# Patient Record
Sex: Male | Born: 1950
Health system: Southern US, Community
[De-identification: ages and names within clinical notes are randomized; demographics above are authoritative.]

## PROBLEM LIST (undated history)

## (undated) DIAGNOSIS — I10 Essential (primary) hypertension: Secondary | ICD-10-CM

## (undated) DIAGNOSIS — R55 Syncope and collapse: Secondary | ICD-10-CM

## (undated) DIAGNOSIS — I5032 Chronic diastolic (congestive) heart failure: Secondary | ICD-10-CM

## (undated) DIAGNOSIS — I482 Chronic atrial fibrillation, unspecified: Secondary | ICD-10-CM

## (undated) DIAGNOSIS — N189 Chronic kidney disease, unspecified: Secondary | ICD-10-CM

## (undated) DIAGNOSIS — J449 Chronic obstructive pulmonary disease, unspecified: Secondary | ICD-10-CM

## (undated) DIAGNOSIS — F1910 Other psychoactive substance abuse, uncomplicated: Secondary | ICD-10-CM

## (undated) DIAGNOSIS — I2510 Atherosclerotic heart disease of native coronary artery without angina pectoris: Secondary | ICD-10-CM

## (undated) DIAGNOSIS — E785 Hyperlipidemia, unspecified: Secondary | ICD-10-CM

## (undated) DIAGNOSIS — B1920 Unspecified viral hepatitis C without hepatic coma: Secondary | ICD-10-CM

## (undated) HISTORY — DX: Hyperlipidemia, unspecified: E78.5

## (undated) HISTORY — DX: Essential (primary) hypertension: I10

## (undated) HISTORY — PX: CARDIAC SURGERY: SHX584

## (undated) HISTORY — DX: Chronic atrial fibrillation, unspecified: I48.20

## (undated) HISTORY — DX: Other psychoactive substance abuse, uncomplicated: F19.10

## (undated) HISTORY — DX: Chronic obstructive pulmonary disease, unspecified: J44.9

## (undated) HISTORY — DX: Chronic diastolic (congestive) heart failure: I50.32

## (undated) HISTORY — PX: BACK SURGERY: SHX140

## (undated) HISTORY — PX: KNEE SURGERY: SHX244

## (undated) HISTORY — DX: Unspecified viral hepatitis C without hepatic coma: B19.20

## (undated) HISTORY — DX: Chronic kidney disease, unspecified: N18.9

## (undated) HISTORY — DX: Syncope and collapse: R55

---

## 2014-08-19 DIAGNOSIS — R768 Other specified abnormal immunological findings in serum: Secondary | ICD-10-CM | POA: Insufficient documentation

## 2016-12-02 DIAGNOSIS — M1990 Unspecified osteoarthritis, unspecified site: Secondary | ICD-10-CM | POA: Insufficient documentation

## 2017-05-17 DIAGNOSIS — I350 Nonrheumatic aortic (valve) stenosis: Secondary | ICD-10-CM

## 2017-05-17 DIAGNOSIS — Z952 Presence of prosthetic heart valve: Secondary | ICD-10-CM | POA: Insufficient documentation

## 2017-05-17 HISTORY — DX: Nonrheumatic aortic (valve) stenosis: I35.0

## 2017-05-17 HISTORY — DX: Presence of prosthetic heart valve: Z95.2

## 2017-06-13 DIAGNOSIS — I340 Nonrheumatic mitral (valve) insufficiency: Secondary | ICD-10-CM | POA: Insufficient documentation

## 2017-06-14 DIAGNOSIS — D638 Anemia in other chronic diseases classified elsewhere: Secondary | ICD-10-CM | POA: Insufficient documentation

## 2017-06-14 DIAGNOSIS — I2510 Atherosclerotic heart disease of native coronary artery without angina pectoris: Secondary | ICD-10-CM | POA: Insufficient documentation

## 2017-06-23 DIAGNOSIS — J9611 Chronic respiratory failure with hypoxia: Secondary | ICD-10-CM | POA: Insufficient documentation

## 2018-06-16 DIAGNOSIS — M19011 Primary osteoarthritis, right shoulder: Secondary | ICD-10-CM | POA: Insufficient documentation

## 2019-11-27 ENCOUNTER — Telehealth: Payer: Self-pay | Admitting: Internal Medicine

## 2019-11-28 ENCOUNTER — Ambulatory Visit: Payer: Self-pay | Admitting: Internal Medicine

## 2019-12-13 ENCOUNTER — Telehealth: Payer: Self-pay | Admitting: Internal Medicine

## 2019-12-14 ENCOUNTER — Encounter (HOSPITAL_BASED_OUTPATIENT_CLINIC_OR_DEPARTMENT_OTHER): Payer: Self-pay

## 2019-12-14 ENCOUNTER — Other Ambulatory Visit: Payer: Self-pay

## 2019-12-14 ENCOUNTER — Emergency Department (HOSPITAL_BASED_OUTPATIENT_CLINIC_OR_DEPARTMENT_OTHER)
Admission: EM | Admit: 2019-12-14 | Discharge: 2019-12-14 | Disposition: A | Discharge status: Home / Self Care | Payer: Medicare Other | Attending: Emergency Medicine | Admitting: Emergency Medicine

## 2019-12-14 ENCOUNTER — Emergency Department (HOSPITAL_BASED_OUTPATIENT_CLINIC_OR_DEPARTMENT_OTHER): Payer: Medicare Other

## 2019-12-14 DIAGNOSIS — Y929 Unspecified place or not applicable: Secondary | ICD-10-CM | POA: Diagnosis not present

## 2019-12-14 DIAGNOSIS — Y998 Other external cause status: Secondary | ICD-10-CM | POA: Diagnosis not present

## 2019-12-14 DIAGNOSIS — M25561 Pain in right knee: Secondary | ICD-10-CM

## 2019-12-14 DIAGNOSIS — X501XXA Overexertion from prolonged static or awkward postures, initial encounter: Secondary | ICD-10-CM | POA: Diagnosis not present

## 2019-12-14 DIAGNOSIS — S80911A Unspecified superficial injury of right knee, initial encounter: Secondary | ICD-10-CM | POA: Diagnosis not present

## 2019-12-14 DIAGNOSIS — Y939 Activity, unspecified: Secondary | ICD-10-CM | POA: Diagnosis not present

## 2019-12-14 DIAGNOSIS — Y999 Unspecified external cause status: Secondary | ICD-10-CM | POA: Diagnosis not present

## 2019-12-14 DIAGNOSIS — S8991XA Unspecified injury of right lower leg, initial encounter: Secondary | ICD-10-CM

## 2019-12-14 MED ORDER — OXYCODONE-ACETAMINOPHEN 5-325 MG PO TABS: 1.0000 | ORAL_TABLET | ORAL | 0 refills | Status: DC | PRN

## 2019-12-14 MED FILL — OXYCODONE-ACETAMINOPHEN 5-3: 5-325 | 3 days supply | Qty: 15 | Fill #0

## 2019-12-14 NOTE — ED Notes (Signed)
Pt provided urinal.

## 2019-12-14 NOTE — ED Provider Notes (Signed)
[facility] EMERGENCY DEPARTMENT Provider Note   CSN: [id number] Arrival date & time: 12/14/19  7341     History Chief Complaint  Patient presents with  . Knee Pain    Charles Rice is a 69 y.o. male.  The history is provided by the patient and medical records. No language interpreter was used.  Knee Pain Location:  Knee Time since incident:  1 day Injury: yes   Mechanism of injury comment:  Banding over/twisted knee overnight Knee location:  R knee Pain details:    Quality:  Sharp   Radiates to:  Does not radiate   Severity:  Severe   Onset quality:  Sudden   Timing:  Constant   Progression:  Waxing and waning Chronicity:  New Dislocation: no   Prior injury to area:  No Relieved by:  Nothing Worsened by:  Bearing weight, extension, flexion and rotation Ineffective treatments:  None tried Associated symptoms: no back pain, no fatigue, no fever, no neck pain, no numbness, no swelling and no tingling        History reviewed. No pertinent past medical history.  There are no problems to display for this patient.   History reviewed. No pertinent surgical history.     History reviewed. No pertinent family history.  Social History   Tobacco Use  . Smoking status: Never Smoker  . Smokeless tobacco: Current User  Substance Use Topics  . Alcohol use: Never  . Drug use: Never    Home Medications Prior to Admission medications   Not on File    Allergies    Patient has no known allergies.  Review of Systems   Review of Systems  Constitutional: Negative for chills, fatigue and fever.  HENT: Negative for congestion.   Eyes: Negative for visual disturbance.  Respiratory: Negative for cough, chest tightness, shortness of breath and wheezing.   Cardiovascular: Negative for chest pain.  Gastrointestinal: Negative for abdominal pain, constipation, diarrhea, nausea and vomiting.  Genitourinary: Negative for dysuria, flank pain and frequency.    Musculoskeletal: Negative for back pain, neck pain and neck stiffness.  Skin: Negative for rash and wound.  Neurological: Negative for light-headedness, numbness and headaches.  Psychiatric/Behavioral: Negative for agitation and confusion.  All other systems reviewed and are negative.   Physical Exam Updated Vital Signs BP 110/69 (BP Location: Right Arm)   Pulse 89   Temp 98.7 F (37.1 C) (Oral)   Resp 16   Ht 6' (1.829 m)   Wt (!) 104.3 kg   SpO2 96%   BMI 31.19 kg/m   Physical Exam Vitals and nursing note reviewed.  Constitutional:      General: He is not in acute distress.    Appearance: He is well-developed. He is not ill-appearing, toxic-appearing or diaphoretic.  HENT:     Head: Normocephalic and atraumatic.  Eyes:     Conjunctiva/sclera: Conjunctivae normal.  Cardiovascular:     Rate and Rhythm: Normal rate and regular rhythm.     Heart sounds: No murmur heard.   Pulmonary:     Effort: Pulmonary effort is normal. No respiratory distress.     Breath sounds: Normal breath sounds.  Abdominal:     General: Abdomen is flat.     Palpations: Abdomen is soft.     Tenderness: There is no abdominal tenderness. There is no right CVA tenderness, left CVA tenderness, guarding or rebound.  Musculoskeletal:        General: Tenderness present. No deformity or signs  of injury.     Cervical back: Neck supple.     Right knee: No deformity, erythema or lacerations. Tenderness present.     Right lower leg: No edema.     Left lower leg: No edema.       Legs:  Skin:    General: Skin is warm and dry.     Capillary Refill: Capillary refill takes less than 2 seconds.     Coloration: Skin is not pale.     Findings: No erythema.  Neurological:     General: No focal deficit present.     Mental Status: He is alert and oriented to person, place, and time.     Sensory: No sensory deficit.     Motor: No weakness.  Psychiatric:        Mood and Affect: Mood normal.     ED Results  / Procedures / Treatments   Labs (all labs ordered are listed, but only abnormal results are displayed) Labs Reviewed - No data to display  EKG None  Radiology DG Knee Complete 4 Views Right  Result Date: 12/14/2019 CLINICAL DATA:  Knee pain EXAM: RIGHT KNEE - COMPLETE 4+ VIEW COMPARISON:  None. FINDINGS: Prior right knee replacement. No hardware complicating feature. No acute bony abnormality. Specifically, no fracture, subluxation, or dislocation. Vascular calcifications noted. IMPRESSION: Prior right knee replacement.  No acute bony abnormality. Electronically Signed   By: Charlett Nose M.D.   On: 12/14/2019 11:09    Procedures Procedures (including critical care time)  Medications Ordered in ED Medications - No data to display  ED Course  I have reviewed the triage vital signs and the nursing notes.  Pertinent labs & imaging results that were available during my care of the patient were reviewed by me and considered in my medical decision making (see chart for details).    MDM Rules/Calculators/A&P                          Charles Rice is a 69 y.o. male with a past medical history significant for bilateral knee replacements and left hip replacement who presents with right knee pain.  Patient reports that last night he was bending down to pick something up and his knee slightly twisted causing immediate onset of severe pain.  He reports he tried to bend over again and the pain was severe.  He reports he had pain with walking and his pain gets up to a 10 out of 10 when he tries to bend his knee.  He denies any recent fevers, chills, chest, or cough.  He denies any rashes or skin infection overlying the knee.  Denies any numbness, tingling, or weakness but reports severe pain.  He reports she did not fall or have direct contact on it.  He has been using his sister's cane to try to ambulate.  Denies other complaints or any other areas of discomfort.  On exam, lungs clear and chest  nontender.  Abdomen nontender.  Patient moving all extremities.  Patient had normal sensation, pulse, and capillary refill in both feet.  Patient had normal strength of the foot.  Patient had pain with knee manipulation on the right.  Tenderness of the knee.  No warmth or redness seen.  No calf tenderness.  Clinically low suspicion for DVT or cellulitis however I am concerned about a ligamentous or soft tissue injury to his knee.  I suspect he had a noncontact twisting type  injury either of the meniscus or a ligament.  We will get x-ray as the knee is replaced however if it is reassuring, anticipate application of a knee immobilizer and follow-up with his orthopedics team.  Based on his history and exam, low suspicion for an infectious arthropathy or septic joint.  Low suspicion for blood clot.  Suspect a injury based on the knee twisting at onset of pain.   1:35 PM Knee x-ray shows prior knee replacement with no acute bony abnormality or hardware abnormality.  Suspect soft tissue injury.  Patient placed in knee immobilizer and will follow up with orthopedics or sports medicine.  He was given both numbers.  He will be given prescription for pain medication.  He was given instructions on conservative management and close follow-up.  He understands return precautions.  He had no other questions or concerns and was discharged in good condition.   Final Clinical Impression(s) / ED Diagnoses Final diagnoses:  Injury of right knee, initial encounter  Acute pain of right knee    Rx / DC Orders ED Discharge Orders         Ordered    oxyCODONE-acetaminophen (PERCOCET/ROXICET) 5-325 MG tablet  Every 4 hours PRN     Discontinue  Reprint     12/14/19 1331         Clinical Impression: 1. Injury of right knee, initial encounter   2. Acute pain of right knee     Disposition: Discharge  Condition: Good  I have discussed the results, Dx and Tx plan with the pt(& family if present). He/she/they  expressed understanding and agree(s) with the plan. Discharge instructions discussed at great length. Strict return precautions discussed and pt &/or family have verbalized understanding of the instructions. No further questions at time of discharge.    New Prescriptions   OXYCODONE-ACETAMINOPHEN (PERCOCET/ROXICET) 5-325 MG TABLET    Take 1 tablet by mouth every 4 (four) hours as needed.    Follow Up: Myra Rude, MD [address] [place] [address] [phone]     Terance Hart, MD [address] [place] [address] [phone]   with orthopedics  [facility] [place] [facility] [address] [id number] [address] [place] [address] [phone]       Aseneth Hack, Canary Brim, MD 12/14/19 1336

## 2019-12-14 NOTE — ED Triage Notes (Signed)
Pt stated right knee pain since yesterday, difficulty bearing weight, s/p knee replacement 2015.

## 2019-12-14 NOTE — ED Notes (Signed)
Pt reports continuing pain to right knee, awaiting MD for xr result.  Provided warm blanket.

## 2019-12-14 NOTE — Discharge Instructions (Signed)
Your history and exam are consistent with a likely is soft tissue knee injury as it occurred while you were bending and twisting.  The x-ray showed the knee replacement with no hardware abnormalities or bony abnormalities.  Please use the pain medicine and knee immobilizer and follow-up with a sports medicine orthopedics provider.  You may need further advanced imaging.  Based on your exam and history we have low suspicion for a blood clot or infection.  Please rest and elevate it.  Please use the pain medicine.  If any symptoms change or worsen, please return to the nearest emergency department as well.

## 2020-01-02 ENCOUNTER — Telehealth: Payer: Self-pay | Admitting: Family Medicine

## 2020-01-02 NOTE — Telephone Encounter (Signed)
Patient has appt next Tues August 24th at 10:10am He wanted to leave all his medical history paperwork.  I advised him not sure Doctor will have the chance to review this information.  But he wanted to leave it anyway.  Putting information in the Dr Nobie Putnam box.

## 2020-01-08 ENCOUNTER — Encounter: Payer: Self-pay | Admitting: Family Medicine

## 2020-01-08 ENCOUNTER — Other Ambulatory Visit: Payer: Self-pay

## 2020-01-08 ENCOUNTER — Encounter (HOSPITAL_BASED_OUTPATIENT_CLINIC_OR_DEPARTMENT_OTHER): Payer: Self-pay | Admitting: *Deleted

## 2020-01-08 ENCOUNTER — Ambulatory Visit (INDEPENDENT_AMBULATORY_CARE_PROVIDER_SITE_OTHER): Payer: Medicare Other | Admitting: Family Medicine

## 2020-01-08 ENCOUNTER — Emergency Department (HOSPITAL_BASED_OUTPATIENT_CLINIC_OR_DEPARTMENT_OTHER)
Admission: EM | Admit: 2020-01-08 | Discharge: 2020-01-08 | Disposition: A | Discharge status: Home / Self Care | Payer: Medicare Other | Attending: Emergency Medicine | Admitting: Emergency Medicine

## 2020-01-08 VITALS — BP 120/64 | HR 78 | Ht 72.0 in | Wt 235.0 lb

## 2020-01-08 DIAGNOSIS — G8929 Other chronic pain: Secondary | ICD-10-CM | POA: Diagnosis not present

## 2020-01-08 DIAGNOSIS — G894 Chronic pain syndrome: Secondary | ICD-10-CM | POA: Diagnosis not present

## 2020-01-08 DIAGNOSIS — Z9189 Other specified personal risk factors, not elsewhere classified: Secondary | ICD-10-CM | POA: Diagnosis not present

## 2020-01-08 DIAGNOSIS — M7918 Myalgia, other site: Secondary | ICD-10-CM | POA: Diagnosis not present

## 2020-01-08 DIAGNOSIS — J209 Acute bronchitis, unspecified: Secondary | ICD-10-CM | POA: Diagnosis not present

## 2020-01-08 DIAGNOSIS — F329 Major depressive disorder, single episode, unspecified: Secondary | ICD-10-CM | POA: Diagnosis not present

## 2020-01-08 DIAGNOSIS — I2510 Atherosclerotic heart disease of native coronary artery without angina pectoris: Secondary | ICD-10-CM | POA: Insufficient documentation

## 2020-01-08 DIAGNOSIS — Z114 Encounter for screening for human immunodeficiency virus [HIV]: Secondary | ICD-10-CM

## 2020-01-08 DIAGNOSIS — Z79899 Other long term (current) drug therapy: Secondary | ICD-10-CM | POA: Insufficient documentation

## 2020-01-08 DIAGNOSIS — Z1159 Encounter for screening for other viral diseases: Secondary | ICD-10-CM

## 2020-01-08 DIAGNOSIS — I509 Heart failure, unspecified: Secondary | ICD-10-CM | POA: Diagnosis not present

## 2020-01-08 DIAGNOSIS — E669 Obesity, unspecified: Secondary | ICD-10-CM | POA: Diagnosis not present

## 2020-01-08 DIAGNOSIS — I10 Essential (primary) hypertension: Secondary | ICD-10-CM | POA: Diagnosis present

## 2020-01-08 DIAGNOSIS — M542 Cervicalgia: Secondary | ICD-10-CM | POA: Insufficient documentation

## 2020-01-08 DIAGNOSIS — F32A Depression, unspecified: Secondary | ICD-10-CM

## 2020-01-08 DIAGNOSIS — J449 Chronic obstructive pulmonary disease, unspecified: Secondary | ICD-10-CM

## 2020-01-08 DIAGNOSIS — J440 Chronic obstructive pulmonary disease with acute lower respiratory infection: Secondary | ICD-10-CM

## 2020-01-08 DIAGNOSIS — Z7689 Persons encountering health services in other specified circumstances: Secondary | ICD-10-CM | POA: Diagnosis not present

## 2020-01-08 HISTORY — DX: Atherosclerotic heart disease of native coronary artery without angina pectoris: I25.10

## 2020-01-08 MED ORDER — FERROUS SULFATE 325 (65 FE) MG PO TABS: 325.0000 mg | ORAL_TABLET | ORAL | 0 refills | Status: DC

## 2020-01-08 MED ORDER — METHOCARBAMOL 500 MG PO TABS: 500.0000 mg | ORAL_TABLET | Freq: Three times a day (TID) | ORAL | 0 refills | Status: AC | PRN

## 2020-01-08 MED ORDER — TOPIRAMATE 100 MG PO TABS: 100.0000 mg | ORAL_TABLET | Freq: Every day | ORAL | 0 refills | Status: DC

## 2020-01-08 MED ORDER — HYDROCODONE-ACETAMINOPHEN 5-325 MG PO TABS: 1.0000 | ORAL_TABLET | ORAL | 0 refills | Status: DC | PRN

## 2020-01-08 MED ORDER — SPIRONOLACTONE 25 MG PO TABS: 25.0000 mg | ORAL_TABLET | Freq: Every day | ORAL | 0 refills | Status: DC

## 2020-01-08 MED ORDER — TETANUS-DIPHTH-ACELL PERTUSSIS 5-2.5-18.5 LF-MCG/0.5 IM SUSP: 0.5000 mL | Freq: Once | INTRAMUSCULAR | 0 refills | Status: AC

## 2020-01-08 MED ORDER — PREDNISONE 10 MG PO TABS: 10.0000 mg | ORAL_TABLET | ORAL | 0 refills | Status: DC

## 2020-01-08 MED ORDER — AMITRIPTYLINE HCL 100 MG PO TABS: 100.0000 mg | ORAL_TABLET | Freq: Every day | ORAL | 0 refills | Status: DC

## 2020-01-08 MED ORDER — FUROSEMIDE 80 MG PO TABS
ORAL_TABLET | ORAL | 0 refills | Status: DC
Start: 2020-01-08 — End: 2020-02-25

## 2020-01-08 MED ORDER — HYDROCODONE-ACETAMINOPHEN 5-325 MG PO TABS
2.0000 | ORAL_TABLET | Freq: Once | ORAL | Status: AC
  Administered 2020-01-08: 2 via ORAL
  Filled 2020-01-08: qty 2

## 2020-01-08 MED ORDER — MAGNESIUM OXIDE 400 MG PO TABS: 400.0000 mg | ORAL_TABLET | Freq: Every day | ORAL | 0 refills | Status: DC

## 2020-01-08 MED ORDER — POTASSIUM CHLORIDE ER 10 MEQ PO TBCR
10.0000 meq | EXTENDED_RELEASE_TABLET | Freq: Every day | ORAL | 0 refills | Status: DC
Start: 2020-01-08 — End: 2020-02-25

## 2020-01-08 NOTE — Progress Notes (Signed)
SUBJECTIVE:   CHIEF COMPLAINT / HPI:   Patient is here to establish care.  PMH EtOH and drug abuse in 2007 clean since 2009  Arthritis in 2002 Chronic pain in 2002 CHF in 2009 COPD in 2009 HTN diagnosed in 2009 Irregular heart beat in 2009 Kidney issues in 2009 Sleep apnea in 2020  Past surgical history 2 knee replacements in 2014 and 2019 Coronary bypass surgery in 2019 Patient had a bone spur in his back operated on in 1992  Family history Maternal alcohol/drug abuse, cancer, depression Paternal alcohol abuse, early death Sister with autoimmune disease and hypertension  Social history Patient currently living with his sister and her husband.  Was incarcerated for the last 10 years.  History of drug abuse including IV drugs, cocaine, alcohol abuse.  He has been clean since being incarcerated.  Denies alcohol or drug abuse at this time, denies sexual activity.  Denies tobacco use.  Is unemployed and will be filing for disability.  OBJECTIVE:   BP 120/64   Pulse 78   Ht 6' (1.829 m)   Wt 235 lb (106.6 kg)   SpO2 98%   BMI 31.87 kg/m   Physical Exam Constitutional:      General: He is not in acute distress.    Appearance: Normal appearance. He is obese. He is not ill-appearing.  HENT:     Head: Normocephalic and atraumatic.     Nose: Nose normal. No congestion or rhinorrhea.     Mouth/Throat:     Mouth: Mucous membranes are moist.     Pharynx: Oropharynx is clear. No oropharyngeal exudate or posterior oropharyngeal erythema.  Eyes:     General: No scleral icterus.       Right eye: No discharge.        Left eye: No discharge.     Extraocular Movements: Extraocular movements intact.     Conjunctiva/sclera: Conjunctivae normal.     Pupils: Pupils are equal, round, and reactive to light.  Cardiovascular:     Rate and Rhythm: Normal rate. Rhythm irregular.     Pulses: Normal pulses.     Heart sounds: No murmur heard.   Pulmonary:     Effort: Pulmonary  effort is normal. No respiratory distress.     Breath sounds: Normal breath sounds. No stridor. No wheezing or rhonchi.  Abdominal:     General: Bowel sounds are normal.     Palpations: Abdomen is soft. There is no mass.     Tenderness: There is no abdominal tenderness.     Hernia: No hernia is present.  Musculoskeletal:        General: Tenderness (Patient with mild neck tenderness but no red flag symptoms) present. Normal range of motion.     Right lower leg: Edema (trace) present.     Left lower leg: Edema (trace) present.  Skin:    General: Skin is warm and dry.  Neurological:     General: No focal deficit present.     Mental Status: He is alert.     Cranial Nerves: No cranial nerve deficit.  Psychiatric:        Mood and Affect: Mood normal.        Behavior: Behavior normal.     ASSESSMENT/PLAN:   Encounter to establish care Patient is here to establish care.  He has complicated past medical history with many comorbidities and medications.  We do not have access to his previous medical records and patient reports that he  is in the process of requesting those.  Patient has a number of medications which he is asking for refills for and is asking for opiate pain medications for chronic pain management. -Patient reports history of A. fib with coronary bypass, cardiology referral made -Patient reports history of COPD pulmonology referral made -Patient reports chronic pain in his neck and back.  He is requesting opiate pain medications at this time but I informed him that his clinic policy that we not do not prescribe opiates on initial visit, he is understanding. -Pain management referral placed -Hepatitis C screening collected today -Follow-up in 1 month for medication management  COPD (chronic obstructive pulmonary disease) (HCC) Patient reports history of COPD and would like a pulmonology referral.  He is currently taking albuterol as needed. -Albuterol inhaler  refilled -Referral placed for pulmonology   Chronic pain Patient reports chronic neck and lower back pain.  He has prescription for oxycodone and would like a refill on this.  I informed him that if this is his initial visit in his clinic policy that we do not fill opiate prescriptions in the first visit. -Ambulatory referral placed for chronic pain management   Hypertension Patient reports history of hypertension along with other heart issues but does not have any of his medical records.  Reports that he has had a bypass in the past and that he takes Lasix and spironolactone.  He reports other blood pressure and heart medications but he does not need refills on those at this time. -Lasix and spironolactone refilled -Patient has trace lower extremity edema but lungs are clear on exam.  He does report occasional shortness of breath with exertion, BNP ordered -CMP ordered -TSH ordered       Derrel Nip, MD [facility], [facility]

## 2020-01-08 NOTE — Assessment & Plan Note (Signed)
Patient reports chronic neck and lower back pain.  He has prescription for oxycodone and would like a refill on this.  I informed him that if this is his initial visit in his clinic policy that we do not fill opiate prescriptions in the first visit. -Ambulatory referral placed for chronic pain management

## 2020-01-08 NOTE — Assessment & Plan Note (Signed)
Patient reports history of hypertension along with other heart issues but does not have any of his medical records.  Reports that he has had a bypass in the past and that he takes Lasix and spironolactone.  He reports other blood pressure and heart medications but he does not need refills on those at this time. -Lasix and spironolactone refilled -Patient has trace lower extremity edema but lungs are clear on exam.  He does report occasional shortness of breath with exertion, BNP ordered -CMP ordered -TSH ordered

## 2020-01-08 NOTE — ED Triage Notes (Signed)
Neck pain x 5 months.

## 2020-01-08 NOTE — Patient Instructions (Signed)
It was a pleasure to meet you today!  We we took care documenting and reordering the medication that you needed refills on but I would like to see you back in 1 month get through the rest of your medical history.  I am going to collect some lab work today and will call you with any abnormal results.  I have also sent referrals in for cardiology, pulmonology as well as pain medicine.  Someone will be contacting you regarding those appointments.  I have sent a prescription for your tetanus vaccine to your pharmacy and you can receive it there.  You have any questions or concerns between now in 1 month please feel free to call clinic.  I hope you have a wonderful afternoon!

## 2020-01-08 NOTE — ED Provider Notes (Signed)
[facility] EMERGENCY DEPARTMENT Provider Note   CSN: [id number] Arrival date & time: 01/08/20  1247     History Chief Complaint  Patient presents with  . Neck Pain    Charles Rice is a 69 y.o. male.  HPI 69 year old male presents with left-sided neck pain.  He states it is chronic in nature and has been there for several months.  Worse over the last 1 week or so.  He has been taking his relatives muscle relaxer with no significant relief.  He cannot take NSAIDs because he is on Eliquis.  No trauma.  No fevers or weakness/numbness.  He has pain when moving his neck.  Went to his PCP appointment for the first time today and was told he cannot get narcotics from them because of this being their first visit.  He states he needs at least 15-20 Percocet. No illicit drug use (has prior history but none recently).   Past Medical History:  Diagnosis Date  . Coronary artery disease   . High cholesterol   . Hypertension     There are no problems to display for this patient.   Past Surgical History:  Procedure Laterality Date  . BACK SURGERY    . CARDIAC SURGERY    . KNEE SURGERY         No family history on file.  Social History   Tobacco Use  . Smoking status: Never Smoker  . Smokeless tobacco: Current User  Substance Use Topics  . Alcohol use: Not Currently  . Drug use: Never    Home Medications Prior to Admission medications   Medication Sig Start Date End Date Taking? Authorizing Provider  albuterol (VENTOLIN HFA) 108 (90 Base) MCG/ACT inhaler Inhale 2 puffs into the lungs every 6 (six) hours as needed.    [provider]  amitriptyline (ELAVIL) 100 MG tablet Take 1 tablet (100 mg total) by mouth at bedtime. 01/08/20   Derrel Nip, MD  ferrous sulfate 325 (65 FE) MG tablet Take 1 tablet (325 mg total) by mouth every other day. 01/08/20   Derrel Nip, MD  furosemide (LASIX) 80 MG tablet 2 tabs (160mg ) every morning and 1 tab (80mg ) every  evening. 01/08/20   Derrel Nip, MD  HYDROcodone-acetaminophen (NORCO) 5-325 MG tablet Take 1 tablet by mouth every 4 (four) hours as needed for severe pain. 01/08/20   Pricilla Loveless, MD  magnesium oxide (MAG-OX) 400 MG tablet Take 1 tablet (400 mg total) by mouth daily. 01/08/20   Derrel Nip, MD  methocarbamol (ROBAXIN) 500 MG tablet Take 1 tablet (500 mg total) by mouth every 8 (eight) hours as needed for muscle spasms. 01/08/20   Pricilla Loveless, MD  metoprolol tartrate (LOPRESSOR) 25 MG tablet Take 25 mg by mouth 2 (two) times daily.    [provider]  oxyCODONE-acetaminophen (PERCOCET/ROXICET) 5-325 MG tablet Take 1 tablet by mouth every 4 (four) hours as needed. 12/14/19   Tegeler, Canary Brim, MD  potassium chloride (KLOR-CON 10) 10 MEQ tablet Take 1 tablet (10 mEq total) by mouth daily. 01/08/20   Derrel Nip, MD  predniSONE (DELTASONE) 10 MG tablet Take 1 tablet (10 mg total) by mouth every other day. 01/08/20   Derrel Nip, MD  spironolactone (ALDACTONE) 25 MG tablet Take 1 tablet (25 mg total) by mouth daily. 01/08/20   Derrel Nip, MD  Tdap Leda Min) 5-2.5-18.5 LF-MCG/0.5 injection Inject 0.5 mLs into the muscle once for 1 dose. 01/08/20 01/08/20  Derrel Nip, MD  topiramate (TOPAMAX) 100 MG tablet Take 1 tablet (100 mg total) by mouth daily. 01/08/20   Derrel Nip, MD    Allergies    Patient has no known allergies.  Review of Systems   Review of Systems  Constitutional: Negative for fever.  Respiratory: Negative for shortness of breath.   Cardiovascular: Negative for chest pain.  Musculoskeletal: Positive for neck pain.  Neurological: Negative for weakness and numbness.  All other systems reviewed and are negative.   Physical Exam Updated Vital Signs BP 108/82   Pulse 89   Temp 97.6 F (36.4 C) (Oral)   Resp 20   Ht 6' (1.829 m)   Wt 104.3 kg   SpO2 95%   BMI 31.19 kg/m   Physical Exam Vitals and nursing note reviewed.    Constitutional:      Appearance: He is well-developed. He is obese.  HENT:     Head: Normocephalic and atraumatic.     Right Ear: External ear normal.     Left Ear: External ear normal.     Nose: Nose normal.  Eyes:     General:        Right eye: No discharge.        Left eye: No discharge.  Neck:      Comments: Left lateral neck is tender to palpation Cardiovascular:     Rate and Rhythm: Normal rate and regular rhythm.     Heart sounds: Normal heart sounds.  Pulmonary:     Effort: Pulmonary effort is normal.     Breath sounds: Normal breath sounds.  Abdominal:     Palpations: Abdomen is soft.     Tenderness: There is no abdominal tenderness.  Musculoskeletal:     Cervical back: Neck supple. Muscular tenderness present. No spinous process tenderness.  Skin:    General: Skin is warm and dry.  Neurological:     Mental Status: He is alert.     Comments: 5/5 strength in all 4 extremities. Grossly normal sensation.  Psychiatric:        Mood and Affect: Mood is not anxious.     ED Results / Procedures / Treatments   Labs (all labs ordered are listed, but only abnormal results are displayed) Labs Reviewed - No data to display  EKG None  Radiology No results found.  Procedures Procedures (including critical care time)  Medications Ordered in ED Medications  HYDROcodone-acetaminophen (NORCO/VICODIN) 5-325 MG per tablet 2 tablet (has no administration in time range)    ED Course  I have reviewed the triage vital signs and the nursing notes.  Pertinent labs & imaging results that were available during my care of the patient were reviewed by me and considered in my medical decision making (see chart for details).    MDM Rules/Calculators/A&P                          Patient appears to be having an exacerbation of chronic pain.  While he does have a remote history of IV drug abuse, he has no fever or other concerning findings on exam to be thinking  osteomyelitis/epidural abscess.  I do not think imaging is needed as he had a negative neck CT in June from [facility] that he shows me the report of.  Otherwise, does not sound like atypical ACS.  He is asking for significant number of Percocet but I can give him a couple days worth of narcotics but he needs  to follow-up with PCP.  We will try muscle relaxer as well.  The pain is more lateral but he has no signs/symptoms to make me think this is a carotid artery dissection. Final Clinical Impression(s) / ED Diagnoses Final diagnoses:  Chronic neck pain    Rx / DC Orders ED Discharge Orders         Ordered    HYDROcodone-acetaminophen (NORCO) 5-325 MG tablet  Every 4 hours PRN        01/08/20 1406    methocarbamol (ROBAXIN) 500 MG tablet  Every 8 hours PRN        01/08/20 1407           Pricilla Loveless, MD 01/08/20 1413

## 2020-01-08 NOTE — Assessment & Plan Note (Signed)
Patient reports history of COPD and would like a pulmonology referral.  He is currently taking albuterol as needed. -Albuterol inhaler refilled -Referral placed for pulmonology

## 2020-01-08 NOTE — Assessment & Plan Note (Addendum)
Patient is here to establish care.  He has complicated past medical history with many comorbidities and medications.  We do not have access to his previous medical records and patient reports that he is in the process of requesting those.  Patient has a number of medications which he is asking for refills for and is asking for opiate pain medications for chronic pain management. -Patient reports history of A. fib with coronary bypass, cardiology referral made -Patient reports history of COPD pulmonology referral made -Patient reports chronic pain in his neck and back.  He is requesting opiate pain medications at this time but I informed him that his clinic policy that we not do not prescribe opiates on initial visit, he is understanding. -Pain management referral placed -Hepatitis C screening collected today -Follow-up in 1 month for medication management

## 2020-01-09 ENCOUNTER — Telehealth: Payer: Self-pay | Admitting: Family Medicine

## 2020-01-09 DIAGNOSIS — R768 Other specified abnormal immunological findings in serum: Secondary | ICD-10-CM

## 2020-01-09 LAB — COMPREHENSIVE METABOLIC PANEL
ALT: 11 IU/L (ref 0–44)
AST: 12 IU/L (ref 0–40)
Albumin/Globulin Ratio: 1.4 (ref 1.2–2.2)
Albumin: 3.9 g/dL (ref 3.8–4.8)
Alkaline Phosphatase: 121 IU/L (ref 48–121)
BUN/Creatinine Ratio: 21 (ref 10–24)
BUN: 35 mg/dL — ABNORMAL HIGH (ref 8–27)
Bilirubin Total: 0.3 mg/dL (ref 0.0–1.2)
CO2: 21 mmol/L (ref 20–29)
Calcium: 8.8 mg/dL (ref 8.6–10.2)
Chloride: 96 mmol/L (ref 96–106)
Creatinine, Ser: 1.63 mg/dL — ABNORMAL HIGH (ref 0.76–1.27)
GFR calc Af Amer: 49 mL/min/{1.73_m2} — ABNORMAL LOW (ref >59)
GFR calc non Af Amer: 42 mL/min/{1.73_m2} — ABNORMAL LOW (ref >59)
Globulin, Total: 2.7 g/dL (ref 1.5–4.5)
Glucose: 89 mg/dL (ref 65–99)
Potassium: 3.7 mmol/L (ref 3.5–5.2)
Sodium: 133 mmol/L — ABNORMAL LOW (ref 134–144)
Total Protein: 6.6 g/dL (ref 6.0–8.5)

## 2020-01-09 LAB — TSH: TSH: 2.37 u[IU]/mL (ref 0.450–4.500)

## 2020-01-09 LAB — CBC
Hematocrit: 49.5 % (ref 37.5–51.0)
Hemoglobin: 16 g/dL (ref 13.0–17.7)
MCH: 27.3 pg (ref 26.6–33.0)
MCHC: 32.3 g/dL (ref 31.5–35.7)
MCV: 85 fL (ref 79–97)
Platelets: 175 10*3/uL (ref 150–450)
RBC: 5.86 x10E6/uL — ABNORMAL HIGH (ref 4.14–5.80)
RDW: 15.4 % (ref 11.6–15.4)
WBC: 11.2 10*3/uL — ABNORMAL HIGH (ref 3.4–10.8)

## 2020-01-09 LAB — BRAIN NATRIURETIC PEPTIDE: BNP: 50.5 pg/mL (ref 0.0–100.0)

## 2020-01-09 LAB — HEPATITIS C ANTIBODY: Hep C Virus Ab: 8.3 s/co ratio — ABNORMAL HIGH (ref 0.0–0.9)

## 2020-01-09 NOTE — Telephone Encounter (Signed)
Called the patient to inform him of his test results and discussed the neck steps in management.  He did not answer but I left a voicemail informing him that he needs to talk to him about his test results.  I will attempt to call him later but he will need more lab work collected.  These labs have been ordered as future orders and patient can come anytime to have the lab work drawn.

## 2020-01-14 ENCOUNTER — Other Ambulatory Visit: Payer: Medicare Other

## 2020-01-14 ENCOUNTER — Other Ambulatory Visit: Payer: Self-pay

## 2020-01-14 DIAGNOSIS — R768 Other specified abnormal immunological findings in serum: Secondary | ICD-10-CM

## 2020-01-15 LAB — HCV RNA QUANT RFLX ULTRA OR GENOTYP: HCV Quant Baseline: NOT DETECTED IU/mL

## 2020-01-17 ENCOUNTER — Encounter: Payer: Self-pay | Admitting: Physical Medicine and Rehabilitation

## 2020-01-17 NOTE — Progress Notes (Signed)
Reviewed: I agree with Dr. Koval's documentation and management. 

## 2020-01-18 DIAGNOSIS — M5030 Other cervical disc degeneration, unspecified cervical region: Secondary | ICD-10-CM | POA: Insufficient documentation

## 2020-01-23 ENCOUNTER — Other Ambulatory Visit: Payer: Self-pay | Admitting: Neurological Surgery

## 2020-01-23 DIAGNOSIS — M5030 Other cervical disc degeneration, unspecified cervical region: Secondary | ICD-10-CM

## 2020-01-24 NOTE — Progress Notes (Signed)
CardiologyConsult  Note    Date:  01/25/2020   ID:  Sharia Reeve, DOB [birth year], MRN [id number]  PCP:  Derrel Nip, MD  Cardiologist:  Armanda Magic, MD   Chief Complaint  Patient presents with  . New Patient (Initial Visit)    CAD, AVR, HTN, HLD    History of Present Illness:  Charles Rice is a 69 y.o. male who is being seen today for the evaluation of HTN, CAD, PAF and CHF at the request of Hensel, Santiago Bumpers, MD.  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  He was incarcerated for the past 10 years and is now out and living in [place].  He had his CABG and AVR in [place], [place] while incarcerated.     He is here today and is doing well.  He denies any chest pain or pressure,  PND, orthopnea, LE edema, dizziness, palpitations or syncope. He has chronic DOE related to his COPD.  He tells me that he is SOB and it has gotten worse over the years.  The SOB is worse bending over and walking. He is compliant with his meds and is tolerating meds with no SE.    Past Medical History:  Diagnosis Date  . Benign essential HTN   . Chronic atrial fibrillation (HCC)   . COPD (chronic obstructive pulmonary disease) (HCC)   . Coronary artery disease    s/p CABG in [place], [place] 2019  . Hyperlipidemia LDL goal <70   . S/P AVR (aortic valve replacement) 2019    Past Surgical History:  Procedure Laterality Date  . BACK SURGERY    . CARDIAC SURGERY    . KNEE SURGERY      Current Medications: Current Meds  Medication Sig  . albuterol (VENTOLIN HFA) 108 (90 Base) MCG/ACT inhaler Inhale 2 puffs into the lungs every 6 (six) hours as needed.  Marland Kitchen amitriptyline (ELAVIL) 100 MG tablet Take 1 tablet (100 mg total) by mouth at bedtime.  . ferrous sulfate 325 (65 FE) MG tablet Take 1 tablet (325 mg total) by mouth every other day.  . furosemide (LASIX) 80 MG tablet 2 tabs  (160mg ) every morning and 1 tab (80mg ) every evening.  . magnesium oxide (MAG-OX) 400 MG tablet Take 1 tablet (400 mg total) by mouth daily.  . methocarbamol (ROBAXIN) 500 MG tablet Take 1 tablet (500 mg total) by mouth every 8 (eight) hours as needed for muscle spasms.  . metoprolol tartrate (LOPRESSOR) 25 MG tablet Take 25 mg by mouth 2 (two) times daily.  oxyCODONE-acetaminophen (PERCOCET/ROXICET) 5-325 MG tablet Take 1 tablet by mouth every 4 (four) hours as needed.  . potassium chloride (KLOR-CON 10) 10 MEQ tablet Take 1 tablet (10 mEq total) by mouth daily.  . predniSONE (DELTASONE) 10 MG tablet Take 1 tablet (10 mg total) by mouth every other day.  . spironolactone (ALDACTONE) 25 MG tablet Take 1 tablet (25 mg total) by mouth daily.  topiramate (TOPAMAX) 100 MG tablet Take 1 tablet (100 mg total) by mouth daily.    Allergies:   Patient has no known allergies.   Social History   Socioeconomic History  . Marital status: Single    Spouse name: Not on file  . Number of children: Not on file  . Years of education: Not on file  .  Highest education level: Not on file  Occupational History  . Not on file  Tobacco Use  . Smoking status: Never Smoker  . Smokeless tobacco: Current User  Substance and Sexual Activity  . Alcohol use: Not Currently  . Drug use: Never  . Sexual activity: Not on file  Other Topics Concern  . Not on file  Social History Narrative  . Not on file   Social Determinants of Health   Financial Resource Strain:   . Difficulty of Paying Living Expenses: Not on file  Food Insecurity:   . Worried About Programme researcher, broadcasting/film/video in the Last Year: Not on file  . Ran Out of Food in the Last Year: Not on file  Transportation Needs:   . Lack of Transportation (Medical): Not on file  . Lack of Transportation (Non-Medical): Not on file  Physical Activity:   . Days of Exercise per Week: Not on file  . Minutes of Exercise per Session: Not on file  Stress:   .  Feeling of Stress : Not on file  Social Connections:   . Frequency of Communication with Friends and Family: Not on file  . Frequency of Social Gatherings with Friends and Family: Not on file  . Attends Religious Services: Not on file  . Active Member of Clubs or Organizations: Not on file  . Attends Banker Meetings: Not on file  . Marital Status: Not on file     Family History:  The patient's family history includes Lung cancer in his father and mother.   ROS:   Please see the history of present illness.    ROS All other systems reviewed and are negative.  No flowsheet data found.   PHYSICAL EXAM:   VS:  BP 124/72   Pulse 84   Ht 6' (1.829 m)   Wt 237 lb (107.5 kg)   SpO2 93%   BMI 32.14 kg/m    GEN: Well nourished, well developed, in no acute distress  HEENT: normal  Neck: no JVD, carotid bruits, or masses Cardiac: irregularly irregular; no murmurs, rubs, or gallops,no edema.  Intact distal pulses bilaterally.  Respiratory:  clear to auscultation bilaterally, normal work of breathing GI: soft, nontender, nondistended, + BS MS: no deformity or atrophy  Skin: warm and dry, no rash Neuro:  Alert and Oriented x 3, Strength and sensation are intact Psych: euthymic mood, full affect  Wt Readings from Last 3 Encounters:  01/25/20 237 lb (107.5 kg)  01/08/20 230 lb (104.3 kg)  01/08/20 235 lb (106.6 kg)      Studies/Labs Reviewed:   EKG:  EKG is ordered today.  The ekg ordered today demonstrates atrial fibrillation with CVR and RBBB, inferior infarct  Recent Labs: 01/08/2020: ALT 11; BNP 50.5; BUN 35; Creatinine, Ser 1.63; Hemoglobin 16.0; Platelets 175; Potassium 3.7; Sodium 133; TSH 2.370   Lipid Panel No results found for: CHOL, TRIG, HDL, CHOLHDL, VLDL, LDLCALC, LDLDIRECT  Additional studies/ records that were reviewed today include:  OV notes from PCP    ASSESSMENT:    1. Coronary artery disease involving native coronary artery of native heart  without angina pectoris   2. S/P AVR (aortic valve replacement)   3. Chronic atrial fibrillation (HCC)   4. Pure hypercholesterolemia   5. Chronic congestive heart failure, unspecified heart failure type (HCC)      PLAN:  In order of problems listed above:  1. ASCAD -he is s/p CABG I 2019 -I will get the  records from [place], [place] -He is not on ASA due to DOAC -continue BB and statin  2.  AV disease -s/p AVR -I will get a copy of records  3.  Chronic atrial fibrillation -HR is well controlled on exam today -he denies any bleeding problems on DOAC -continue Eliquis 5mg  BID and Lopressor 25mg  BID -SCr was 1.63 and Hbg 16 in Aug 2021  4.  HLD -LDL goal < 70 -check FLP  -ALT was normal in Aug 2021 -continue Lipitor 10mg  daily  5.  Chronic CHF -I do not have any records so do not know if this is systolic or diastolic CHF -He appears euvolemic on exam -continue Lasix 160mg  qam and 80mg  qpm -continue spiro 25mg  daily and Lopressor -check 2D echo to assess LVF   Medication Adjustments/Labs and Tests Ordered: Current medicines are reviewed at length with the patient today.  Concerns regarding medicines are outlined above.  Medication changes, Labs and Tests ordered today are listed in the Patient Instructions below.  There are no Patient Instructions on file for this visit.   Signed, Armanda Magic, MD  01/25/2020 10:23 AM    [facility] [address] [place], [place], [place]  [address] Phone: [phone]; Fax: [phone]

## 2020-01-25 ENCOUNTER — Ambulatory Visit (INDEPENDENT_AMBULATORY_CARE_PROVIDER_SITE_OTHER): Payer: Medicare Other | Admitting: Cardiology

## 2020-01-25 ENCOUNTER — Encounter: Payer: Self-pay | Admitting: Cardiology

## 2020-01-25 ENCOUNTER — Other Ambulatory Visit: Payer: Self-pay

## 2020-01-25 VITALS — BP 124/72 | HR 84 | Ht 72.0 in | Wt 237.0 lb

## 2020-01-25 DIAGNOSIS — I4891 Unspecified atrial fibrillation: Secondary | ICD-10-CM | POA: Insufficient documentation

## 2020-01-25 DIAGNOSIS — I2510 Atherosclerotic heart disease of native coronary artery without angina pectoris: Secondary | ICD-10-CM | POA: Diagnosis not present

## 2020-01-25 DIAGNOSIS — I482 Chronic atrial fibrillation, unspecified: Secondary | ICD-10-CM | POA: Diagnosis not present

## 2020-01-25 DIAGNOSIS — Z952 Presence of prosthetic heart valve: Secondary | ICD-10-CM

## 2020-01-25 DIAGNOSIS — E7800 Pure hypercholesterolemia, unspecified: Secondary | ICD-10-CM | POA: Diagnosis not present

## 2020-01-25 DIAGNOSIS — I509 Heart failure, unspecified: Secondary | ICD-10-CM

## 2020-01-25 LAB — LIPID PANEL
Chol/HDL Ratio: 3.5 ratio (ref 0.0–5.0)
Cholesterol, Total: 165 mg/dL (ref 100–199)
HDL: 47 mg/dL (ref >39)
LDL Chol Calc (NIH): 94 mg/dL (ref 0–99)
Triglycerides: 139 mg/dL (ref 0–149)
VLDL Cholesterol Cal: 24 mg/dL (ref 5–40)

## 2020-01-25 NOTE — Patient Instructions (Signed)
Medication Instructions:  Your physician recommends that you continue on your current medications as directed. Please refer to the Current Medication list given to you today.  *If you need a refill on your cardiac medications before your next appointment, please call your pharmacy*   Lab Work: TODAY: FLP If you have labs (blood work) drawn today and your tests are completely normal, you will receive your results only by: Marland Kitchen MyChart Message (if you have MyChart) OR . A paper copy in the mail If you have any lab test that is abnormal or we need to change your treatment, we will call you to review the results.   Testing/Procedures: Your physician has requested that you have an echocardiogram. Echocardiography is a painless test that uses sound waves to create images of your heart. It provides your doctor with information about the size and shape of your heart and how well your heart's chambers and valves are working. This procedure takes approximately one hour. There are no restrictions for this procedure.   Follow-Up: At [facility], you and your health needs are our priority.  As part of our continuing mission to provide you with exceptional heart care, we have created designated Provider Care Teams.  These Care Teams include your primary Cardiologist (physician) and Advanced Practice Providers (APPs -  Physician Assistants and Nurse Practitioners) who all work together to provide you with the care you need, when you need it.  Your next appointment:   6 month(s)  The format for your next appointment:   In Person  Provider:   You may see Armanda Magic, MD or one of the following Advanced Practice Providers on your designated Care Team:    Ronie Spies, PA-C  Jacolyn Reedy, PA-C

## 2020-01-25 NOTE — Addendum Note (Signed)
Addended by: Theresia Majors on: 01/25/2020 10:27 AM   Modules accepted: Orders

## 2020-01-28 ENCOUNTER — Telehealth: Payer: Self-pay | Admitting: Cardiology

## 2020-01-28 NOTE — Telephone Encounter (Signed)
The patient has been notified of the result and verbalized understanding.  All questions (if any) were answered. Theresia Majors, RN 01/28/2020 10:01 AM

## 2020-01-28 NOTE — Telephone Encounter (Signed)
Patient is calling to receive his results. Please call back 

## 2020-01-28 NOTE — Telephone Encounter (Signed)
Spoke with the patient and advised him that once I talk with Dr. Mayford Knife about the cholesterol medication I will send it in to his pharmacy.

## 2020-01-28 NOTE — Telephone Encounter (Signed)
Charles Rice is calling in regards to his previous conversation with Charles Rice. He is wanting to know if the cholesterol medication she was stating will be discussed with Charles Rice to see which it is she is wanting to have him on could go ahead and be sent to the [id number] - [place], [place] - [address] Please advise.

## 2020-01-29 ENCOUNTER — Other Ambulatory Visit: Payer: Self-pay

## 2020-01-29 ENCOUNTER — Encounter: Payer: Self-pay | Admitting: Family Medicine

## 2020-01-29 ENCOUNTER — Ambulatory Visit (INDEPENDENT_AMBULATORY_CARE_PROVIDER_SITE_OTHER): Payer: Medicare Other | Admitting: Family Medicine

## 2020-01-29 ENCOUNTER — Telehealth: Payer: Self-pay

## 2020-01-29 DIAGNOSIS — E7800 Pure hypercholesterolemia, unspecified: Secondary | ICD-10-CM

## 2020-01-29 DIAGNOSIS — M25512 Pain in left shoulder: Secondary | ICD-10-CM | POA: Diagnosis not present

## 2020-01-29 DIAGNOSIS — G894 Chronic pain syndrome: Secondary | ICD-10-CM

## 2020-01-29 DIAGNOSIS — I2510 Atherosclerotic heart disease of native coronary artery without angina pectoris: Secondary | ICD-10-CM | POA: Diagnosis present

## 2020-01-29 DIAGNOSIS — G8929 Other chronic pain: Secondary | ICD-10-CM | POA: Diagnosis not present

## 2020-01-29 DIAGNOSIS — E785 Hyperlipidemia, unspecified: Secondary | ICD-10-CM

## 2020-01-29 DIAGNOSIS — Z23 Encounter for immunization: Secondary | ICD-10-CM

## 2020-01-29 MED ORDER — ATORVASTATIN CALCIUM 20 MG PO TABS
20.0000 mg | ORAL_TABLET | Freq: Every day | ORAL | 3 refills | Status: AC
Start: 2020-01-29 — End: ?

## 2020-01-29 MED ORDER — ATROVENT HFA 17 MCG/ACT IN AERS: 2.0000 | INHALATION_SPRAY | Freq: Four times a day (QID) | RESPIRATORY_TRACT | 12 refills | Status: AC | PRN

## 2020-01-29 MED ORDER — AMITRIPTYLINE HCL 100 MG PO TABS: 100.0000 mg | ORAL_TABLET | Freq: Every day | ORAL | 0 refills | Status: DC

## 2020-01-29 MED ORDER — ALBUTEROL SULFATE HFA 108 (90 BASE) MCG/ACT IN AERS: 2.0000 | INHALATION_SPRAY | Freq: Four times a day (QID) | RESPIRATORY_TRACT | 2 refills | Status: AC | PRN

## 2020-01-29 MED ORDER — PREDNISONE 10 MG PO TABS: 10.0000 mg | ORAL_TABLET | ORAL | 0 refills | Status: DC

## 2020-01-29 NOTE — Telephone Encounter (Signed)
-----   Message from Quintella Reichert, MD sent at 01/28/2020 11:04 PM EDT ----- Start Lipitor 20mg  daily and repeat FLP and ALT in 6 weeks

## 2020-01-29 NOTE — Progress Notes (Signed)
SUBJECTIVE:   CHIEF COMPLAINT / HPI:   Pain  Patient reports continued issues with pain especially in his neck and back as well as left shoulder.  This is a chronic issue which she reports he saw a neurologist who said he may need surgery and recommended an MRI.  The MRI has been ordered by the neurologist.  The neurologist also provided him with 40 Ultram which the patient reports he has taken all of them and would like a refill.  Patient also reports that he needs clearance from cardiology before he can have any procedures completed.  He has seen the cardiologist and they have scheduled further evaluations to determine if he is able to have surgery.  Patient repeatedly asks for narcotics and we have a discussion about the fact that we do not prescribe chronic pain medicines and that he will need to follow-up with the pain management specialist.  He has an appointment scheduled with them but is concerned because it says they will not prescribe him narcotics on the first visit.  He reports that the pain is caused him increased anxiety and he states "could I get some Xanax to help with that".  I discussed that I will not be prescribing him benzodiazepines but would be open to prescribing him an SSRI.  Patient is not willing to do this because he reports he has tried them in the past and did not like them.  I discussed other options but the patient reports that he will wait for further management of his pain before he requests medication for anxiety.  Hyperlipidemia  Patient has cholesterol above goal per cardiology.  They have started him on atorvastatin.  Patient reports that he is going to pick up the medication from the pharmacy today.  Shoulder pain  Patient reports that he has been diagnosed with some form of shoulder injury which requires surgery and says that the surgery was scheduled while he was in prison but because of Covid the surgery was canceled.  He would like a referral to orthopedic  surgery but does not want the referral placed at this time.  He wants to be cleared by cardiology before he moves forward with any other referrals.   OBJECTIVE:   BP 128/62   Pulse 65   Ht 6' (1.829 m)   Wt 234 lb 9.6 oz (106.4 kg)   SpO2 97%   BMI 31.82 kg/m   General: Alert, well-appearing 69 year old in no acute distress although he does occasionally rub his shoulder and reports pain with movement in his shoulder and neck. Cardiac: Regular rate and rhythm, no murmurs appreciated Respiratory: Normal work of breathing, lungs clear to auscultation bilaterally Abdomen: Soft, nontender, positive bowel sounds MSK: Decreased range of motion of left shoulder, decreased range of motion neck, no edema in lower extremities  ASSESSMENT/PLAN:   Chronic pain Patient reports continued chronic pain in his neck and back as well as shoulder.  He received 40 Ultram from the neurologist on 9/3 and has taken all but 4 of them.  He would like a refill on these.  I discussed that he will need to see pain management for chronic pain medications and if he needs coverage between now and then he will need to speak to the provider who gave him the first prescription. -Encourage patient to follow-up with pain management and he has an appointment scheduled him -Encourage patient to contact neurosurgeon if urgent need for refill   Hyperlipidemia LDL goal <70  Patient recently had lipid panel collected by cardiology.  Patient is not at goal.  Patient was started on atorvastatin. -Patient reports he will pick the prescription up today for the atorvastatin and begin to take it  Chronic shoulder pain Patient reports that he has shoulder pain which is chronic that he has injured this shoulder in the past and was told he needed surgery for this.  He reports that the surgery was scheduled but due to Covid procedure was canceled.  He would like a referral for orthopedic surgery but would like to wait until he is cleared  by cardiology for any surgeries. -We will follow-up at next visit and possibly refer to orthopedic surgery at that time     Derrel Nip, MD [facility]

## 2020-01-29 NOTE — Patient Instructions (Addendum)
It was a pleasure to see you today.  I am sorry you are still having issues with this pain and I am glad that you saw the neurologist who had thoughts on what may be the source of the pain.  Please be sure to get your MRI completed so that we can know the neck steps in management.  Please also ensure that you go to your echocardiogram for surgical clearance purposes.  Regarding pain management I recommend you contact your neurologist for refills and be sure to follow-up with the pain medicine specialist appointment on October 7.  I have sent prescription refills and for your amitriptyline, your prednisone, and your albuterol.  Please let me know if there is anything I can do for you.  I hope you have a wonderful afternoon!

## 2020-01-29 NOTE — Telephone Encounter (Signed)
Left message for patient (ok per DPR). Advised to call back with any questions.

## 2020-02-01 DIAGNOSIS — E785 Hyperlipidemia, unspecified: Secondary | ICD-10-CM | POA: Insufficient documentation

## 2020-02-01 DIAGNOSIS — M25519 Pain in unspecified shoulder: Secondary | ICD-10-CM | POA: Insufficient documentation

## 2020-02-01 DIAGNOSIS — G8929 Other chronic pain: Secondary | ICD-10-CM | POA: Insufficient documentation

## 2020-02-01 NOTE — Assessment & Plan Note (Signed)
Patient reports that he has shoulder pain which is chronic that he has injured this shoulder in the past and was told he needed surgery for this.  He reports that the surgery was scheduled but due to Covid procedure was canceled.  He would like a referral for orthopedic surgery but would like to wait until he is cleared by cardiology for any surgeries. -We will follow-up at next visit and possibly refer to orthopedic surgery at that time

## 2020-02-01 NOTE — Assessment & Plan Note (Signed)
Patient reports continued chronic pain in his neck and back as well as shoulder.  He received 40 Ultram from the neurologist on 9/3 and has taken all but 4 of them.  He would like a refill on these.  I discussed that he will need to see pain management for chronic pain medications and if he needs coverage between now and then he will need to speak to the provider who gave him the first prescription. -Encourage patient to follow-up with pain management and he has an appointment scheduled him -Encourage patient to contact neurosurgeon if urgent need for refill

## 2020-02-01 NOTE — Assessment & Plan Note (Signed)
Patient recently had lipid panel collected by cardiology.  Patient is not at goal.  Patient was started on atorvastatin. -Patient reports he will pick the prescription up today for the atorvastatin and begin to take it

## 2020-02-05 ENCOUNTER — Other Ambulatory Visit: Payer: Self-pay | Admitting: Family Medicine

## 2020-02-08 ENCOUNTER — Other Ambulatory Visit: Payer: Self-pay

## 2020-02-08 MED ORDER — APIXABAN 5 MG PO TABS: 5.0000 mg | ORAL_TABLET | Freq: Two times a day (BID) | ORAL | 3 refills | Status: AC

## 2020-02-08 MED ORDER — TOPIRAMATE 100 MG PO TABS
100.0000 mg | ORAL_TABLET | Freq: Every day | ORAL | 0 refills | Status: DC
Start: 2020-02-08 — End: 2020-03-04

## 2020-02-08 NOTE — Addendum Note (Signed)
Addended by: Celedonio Savage on: 02/08/2020 07:54 PM   Modules accepted: Orders

## 2020-02-08 NOTE — Telephone Encounter (Signed)
Patient calls nurse line requesting refills on topamax and eliquis. Advised patient that we have not been prescribing eliquis. Patient reports that he received a large supply of Eliquis before leaving prison. Advised patient that I would send refill request to provider, however, he may need to be seen in the office again for lab work.   To PCP  Veronda Prude, RN

## 2020-02-08 NOTE — Telephone Encounter (Signed)
Refill for patient's Topamax sent to his pharmacy.  I do not see his Eliquis dosage in his chart but per cardiology's note he takes 5 mg twice daily and they would like for him to continue with that dosage.  I attempted to call the patient but he did not answer his phone.  I am going to send in the prescription for the Eliquis 5 mg twice daily.  I instructed him that if this is not correct for him to please call and we can make the necessary changes.

## 2020-02-11 ENCOUNTER — Telehealth: Payer: Self-pay | Admitting: *Deleted

## 2020-02-11 NOTE — Telephone Encounter (Signed)
Pt called regarding referral to pulmonology and left a message that he hasn't heard about an appointment yet.  I tried to call patient back but had to leave a message.  Referral was placed in [facility] workqueue on 01-08-20.  He can call their office to check on the status [phone].  Sanita Estrada,CMA

## 2020-02-12 ENCOUNTER — Other Ambulatory Visit: Payer: Self-pay

## 2020-02-12 ENCOUNTER — Ambulatory Visit (HOSPITAL_COMMUNITY): Payer: Medicare Other | Attending: Cardiology

## 2020-02-12 DIAGNOSIS — I509 Heart failure, unspecified: Secondary | ICD-10-CM | POA: Insufficient documentation

## 2020-02-12 LAB — ECHOCARDIOGRAM COMPLETE
AV Mean grad: 15.8 mmHg
AV Peak grad: 28.9 mmHg
Ao pk vel: 2.69 m/s
S' Lateral: 4.1 cm

## 2020-02-13 ENCOUNTER — Telehealth: Payer: Self-pay | Admitting: Cardiology

## 2020-02-13 NOTE — Telephone Encounter (Signed)
The patient has been notified of the result and verbalized understanding.  All questions (if any) were answered. Theresia Majors, RN 02/13/2020 5:27 PM

## 2020-02-13 NOTE — Telephone Encounter (Signed)
Follow up:     Patient returning your call back. [phone]

## 2020-02-14 ENCOUNTER — Other Ambulatory Visit: Payer: Self-pay

## 2020-02-14 ENCOUNTER — Ambulatory Visit
Admission: RE | Admit: 2020-02-14 | Discharge: 2020-02-14 | Disposition: A | Discharge status: Home / Self Care | Payer: Medicare Other | Source: Ambulatory Visit | Attending: Neurological Surgery | Admitting: Neurological Surgery

## 2020-02-14 DIAGNOSIS — M5030 Other cervical disc degeneration, unspecified cervical region: Secondary | ICD-10-CM

## 2020-02-15 DIAGNOSIS — Z6832 Body mass index (BMI) 32.0-32.9, adult: Secondary | ICD-10-CM | POA: Insufficient documentation

## 2020-02-16 ENCOUNTER — Other Ambulatory Visit: Payer: Self-pay | Admitting: Family Medicine

## 2020-02-18 ENCOUNTER — Encounter: Payer: Self-pay | Admitting: Cardiology

## 2020-02-18 ENCOUNTER — Telehealth: Payer: Self-pay | Admitting: *Deleted

## 2020-02-18 DIAGNOSIS — B1920 Unspecified viral hepatitis C without hepatic coma: Secondary | ICD-10-CM | POA: Insufficient documentation

## 2020-02-18 DIAGNOSIS — I5032 Chronic diastolic (congestive) heart failure: Secondary | ICD-10-CM | POA: Insufficient documentation

## 2020-02-18 DIAGNOSIS — N189 Chronic kidney disease, unspecified: Secondary | ICD-10-CM | POA: Insufficient documentation

## 2020-02-18 DIAGNOSIS — F1910 Other psychoactive substance abuse, uncomplicated: Secondary | ICD-10-CM | POA: Insufficient documentation

## 2020-02-18 DIAGNOSIS — R55 Syncope and collapse: Secondary | ICD-10-CM | POA: Insufficient documentation

## 2020-02-18 NOTE — Telephone Encounter (Signed)
   [facility] Pre-operative Risk Assessment    HEARTCARE STAFF: - Please ensure there is not already an duplicate clearance open for this procedure. - Under Visit Info/Reason for Call, type in Other and utilize the format Clearance MM/DD/YY or Clearance TBD. Do not use dashes or single digits. - If request is for dental extraction, please clarify the # of teeth to be extracted.  Request for surgical clearance:  1. What type of surgery is being performed? C3-4 ANTERIOR CERVICAL DECOMPRESSION/DISCECTOMY/FUSION   2. When is this surgery scheduled? TBD   3. What type of clearance is required (medical clearance vs. Pharmacy clearance to hold med vs. Both)? BOTH  4. Are there any medications that need to be held prior to surgery and how long? ELIQUIS   5. Practice name and name of physician performing surgery? [place]; DR. Kristeen Miss   6. What is the office phone number? [phone]   7.   What is the office fax number? [place]: JESSICA  8.   Anesthesia type (None, local, MAC, general) ? GENERAL   Julaine Hua 02/18/2020, 2:48 PM  _________________________________________________________________   (provider comments below)

## 2020-02-19 NOTE — Telephone Encounter (Signed)
Patient with diagnosis of A Fib on Eliquis for anticoagulation.    Procedure: C3-4 ANTERIOR CERVICAL DECOMPRESSION/DISCECTOMY/FUSION   Date of procedure: TBD  CHADS2-VASc score of  4 (CHF, HTN, AGE, CAD)  CrCl 53.82 ml/min using adjusted body weight  Per office protocol, patient can hold Eliquis for 3 days prior to procedure.    Patient will not need bridging with Lovenox (enoxaparin) around procedure.  If not bridging, patient should restart Eliquis on the evening of procedure or day after, at discretion of procedure MD

## 2020-02-19 NOTE — Telephone Encounter (Signed)
Left VM

## 2020-02-21 ENCOUNTER — Encounter: Payer: Medicare Other | Admitting: Physical Medicine and Rehabilitation

## 2020-02-22 NOTE — Telephone Encounter (Signed)
   Primary Cardiologist: Armanda Magic, MD  Chart reviewed as part of pre-operative protocol coverage. Given past medical history and time since last visit, based on ACC/AHA guidelines, Charles Rice would be at acceptable risk for the planned procedure without further cardiovascular testing. Patient has stable SOB which he attributes to COPD.Recent echo showed LVEF of 50-55%.  The patient was advised that if he develops new symptoms prior to surgery to contact our office to arrange for a follow-up visit, and he verbalized understanding.  I will route this recommendation to the requesting party via Epic fax function and remove from pre-op pool.  Please call with questions.  [place], [place] 02/22/2020, 9:28 AM

## 2020-02-24 ENCOUNTER — Other Ambulatory Visit: Payer: Self-pay | Admitting: Family Medicine

## 2020-03-01 ENCOUNTER — Other Ambulatory Visit: Payer: Self-pay | Admitting: Family Medicine

## 2020-03-04 ENCOUNTER — Other Ambulatory Visit: Payer: Self-pay | Admitting: Family Medicine

## 2020-03-04 ENCOUNTER — Encounter: Payer: Self-pay | Admitting: Internal Medicine

## 2020-03-04 ENCOUNTER — Ambulatory Visit (INDEPENDENT_AMBULATORY_CARE_PROVIDER_SITE_OTHER): Payer: Medicare Other | Admitting: Internal Medicine

## 2020-03-04 ENCOUNTER — Other Ambulatory Visit: Payer: Self-pay

## 2020-03-04 VITALS — BP 120/70 | HR 80 | Temp 98.2°F | Ht 72.0 in | Wt 242.5 lb

## 2020-03-04 DIAGNOSIS — J449 Chronic obstructive pulmonary disease, unspecified: Secondary | ICD-10-CM

## 2020-03-04 MED ORDER — ANORO ELLIPTA 62.5-25 MCG/INH IN AEPB: 1.0000 | INHALATION_SPRAY | Freq: Every day | RESPIRATORY_TRACT | 5 refills | Status: AC

## 2020-03-04 NOTE — Patient Instructions (Addendum)
The patient should have follow up scheduled with myself in 1 months.   Prior to next visit patient should have: Full set of PFTs  Start taking Anoro once a day every day. You can stop taking Advair.  Take the albuterol rescue inhaler every 4 to 6 hours as needed for wheezing or shortness of breath. You can also take it 15 minutes before exercise or exertional activity. Side effects include heart racing or pounding, jitters or anxiety. If you have a history of an irregular heart rhythm, it can make this worse. Can also give some patients a hard time sleeping.  To inhale the aerosol using an inhaler, follow these steps:  1. Remove the protective dust cap from the end of the mouthpiece. If the dust cap was not placed on the mouthpiece, check the mouthpiece for dirt or other objects. Be sure that the canister is fully and firmly inserted in the mouthpiece. 2. If you are using the inhaler for the first time or if you have not used the inhaler in more than 14 days, you will need to prime it. You may also need to prime the inhaler if it has been dropped. Ask your pharmacist or check the manufacturer's information if this happens. To prime the inhaler, shake it well and then press down on the canister 4 times to release 4 sprays into the air, away from your face. Be careful not to get albuterol in your eyes. 3. Shake the inhaler well. 4. Breathe out as completely as possible through your mouth. 4. Hold the canister with the mouthpiece on the bottom, facing you and the canister pointing upward. Place the open end of the mouthpiece into your mouth. Close your lips tightly around the mouthpiece. 6. Breathe in slowly and deeply through the mouthpiece.At the same time, press down once on the container to spray the medication into your mouth. 7. Try to hold your breath for 10 seconds. remove the inhaler, and breathe out slowly. 8. If you were told to use 2 puffs, wait 1 minute and then repeat steps 3-7. 9.  Replace the protective cap on the inhaler. 10. Clean your inhaler regularly. Follow the manufacturer's directions carefully and ask your doctor or pharmacist if you have any questions about cleaning your inhaler.  Check the back of the inhaler to keep track of the total number of doses left on the inhaler.

## 2020-03-04 NOTE — Progress Notes (Signed)
Charles Rice    [id number]    1950-08-01  Primary Care Physician:Cresenzo, Alecia Lemming, MD  Referring Physician: Moses Manners, MD [address] [place],  [place] [address] Reason for Consultation: COPD Date of Consultation: 03/04/2020  Chief complaint:   Chief Complaint  Patient presents with  . Consult    COPD     HPI: Charles Rice is a 69 y.o. gentleman with history of CAD s/p CABG 2019 and AVR with chronic Atrial firbillation on eliquis. He is here for new patient evaluation for his COPD. Diagnosed with COPD 6-7 years ago. He feels his breathing is getting worse. And was off oxygen after his surgery in 2019.  He was previously incarcerated. He has had a concentrator for oxygen but wasn't able to take it with him when he was released.  He has dyspnea with minimal exertion including going to the bathroom, eating, talking, making his bed. He is minimally active due tot his.   Takes advair for his breathing which helps but is no longer being covered by insurance. Takes albuterol rescue inhaler up to 4-5 times/day.   Last visit 9 months ago for COPD exacerbation. Never been admitted or intubated.   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  Exposures: previously incarcerated.  Smoking history: 120 pack years.  Social History   Occupational History  . Not on file  Tobacco Use  . Smoking status: Former Smoker    Packs/day: 3.00    Types: Cigarettes    Start date: 05/1965    Quit date: 06/2007    Years since quitting: 12.7  . Smokeless tobacco: Current User  Substance and Sexual Activity  . Alcohol use: Not Currently  . Drug use: Never  . Sexual activity: Not on file    Relevant family history:  Family History  Problem Relation Age of Onset  . Lung cancer Mother   . Lung cancer Father     Past Medical History:  Diagnosis Date  . Aortic stenosis 2019   s/p AVR  . Benign essential HTN   . Chronic atrial  fibrillation (HCC)   . Chronic diastolic CHF (congestive heart failure) (HCC)   . CKD (chronic kidney disease)   . COPD (chronic obstructive pulmonary disease) (HCC)   . Coronary artery disease    s/p CABG in [place], [place] 2019  . Hepatitis C   . Hyperlipidemia LDL goal <70   . Polysubstance abuse (HCC)   . Syncope    orthostatic hypotension in setting of dehydration from overdiuresis- Treated at [facility]    Past Surgical History:  Procedure Laterality Date  . BACK SURGERY    . CARDIAC SURGERY    . KNEE SURGERY       Physical Exam: Blood pressure 120/70, pulse 80, temperature 98.2 F (36.8 C), temperature source Oral, height 6' (1.829 m), weight 242 lb 8 oz (110 kg), SpO2 96 %. Gen:      No acute distress ENT:  no nasal polyps, mucus membranes moist Lungs:    No increased respiratory effort, symmetric chest wall excursion, clear to auscultation bilaterally, diminished, no wheezes CV:         Regular rate and rhythm; no murmurs, rubs, or gallops.  No pedal edema Abd:      + bowel sounds; soft, non-tender; no distension MSK: no acute synovitis of DIP or PIP joints, no mechanics hands.  Skin:  Warm and dry; no rashes Neuro: normal speech, no focal facial asymmetry Psych: alert and oriented x3, normal mood and affect   Data Reviewed/Medical Decision Making:  Independent interpretation of tests: Imaging: None available  PFTs: None on file  Labs:  Lab Results  Component Value Date   WBC 11.2 (H) 01/08/2020   HGB 16.0 01/08/2020   HCT 49.5 01/08/2020   MCV 85 01/08/2020   PLT 175 01/08/2020   Lab Results  Component Value Date   NA 133 (L) 01/08/2020   K 3.7 01/08/2020   CL 96 01/08/2020   CO2 21 01/08/2020     Immunization status:  Immunization History  Administered Date(s) Administered  . Moderna SARS-COVID-2 Vaccination 06/14/2019, 07/11/2019  . Pneumococcal Polysaccharide-23 01/29/2020    . I reviewed prior external note(s) from  Dr. Leveda Anna . I reviewed the result(s) of the labs and imaging as noted above.  . I have ordered PFT   Assessment:  COPD  120 pack year smoking history CAD s/p CABG  Plan/Recommendations: Charles Rice is a 69 y.o. gentleman with COPD who presents with progressive symptoms. Will plan to obtain PFTs to further evaluate his disease. Will switch him from Advair to [facility] which is a better fit for his symptoms as well as covered by insurance. Continue albuterol as needed.  He also thinks he needs oxygen at home for nocturnal use so will order overnight oximetry. At next visit he may need referral for lung cancer screening as well as ambulatory desaturation study.   We discussed disease management and progression at length today.    Return to Care: Return in about 4 weeks (around 04/01/2020).  Durel Salts, MD Pulmonary and Critical Care Medicine [facility] Office:[phone]  CC: Leveda Anna Santiago Bumpers, MD

## 2020-03-11 ENCOUNTER — Ambulatory Visit: Payer: Medicare Other

## 2020-03-11 ENCOUNTER — Other Ambulatory Visit: Payer: Medicare Other

## 2020-03-17 ENCOUNTER — Ambulatory Visit (INDEPENDENT_AMBULATORY_CARE_PROVIDER_SITE_OTHER): Payer: Medicare Other | Admitting: Family Medicine

## 2020-03-17 ENCOUNTER — Other Ambulatory Visit: Payer: Self-pay

## 2020-03-17 ENCOUNTER — Other Ambulatory Visit: Payer: Medicare Other | Admitting: *Deleted

## 2020-03-17 VITALS — BP 126/60 | HR 51 | Wt 233.4 lb

## 2020-03-17 DIAGNOSIS — E7800 Pure hypercholesterolemia, unspecified: Secondary | ICD-10-CM

## 2020-03-17 DIAGNOSIS — R400 Somnolence: Secondary | ICD-10-CM | POA: Diagnosis present

## 2020-03-17 DIAGNOSIS — I2510 Atherosclerotic heart disease of native coronary artery without angina pectoris: Secondary | ICD-10-CM

## 2020-03-17 DIAGNOSIS — R531 Weakness: Secondary | ICD-10-CM | POA: Insufficient documentation

## 2020-03-17 DIAGNOSIS — R296 Repeated falls: Secondary | ICD-10-CM | POA: Insufficient documentation

## 2020-03-17 LAB — ALT: ALT: 9 IU/L (ref 0–44)

## 2020-03-17 LAB — LIPID PANEL
Chol/HDL Ratio: 3.9 ratio (ref 0.0–5.0)
Cholesterol, Total: 131 mg/dL (ref 100–199)
HDL: 34 mg/dL — ABNORMAL LOW (ref >39)
LDL Chol Calc (NIH): 73 mg/dL (ref 0–99)
Triglycerides: 135 mg/dL (ref 0–149)
VLDL Cholesterol Cal: 24 mg/dL (ref 5–40)

## 2020-03-17 NOTE — Assessment & Plan Note (Addendum)
Most recently this morning.  States that he did not hit his head, just falls onto his body.  He is on Eliquis.  He has neurologically intact on exam today and given that he has no history of hitting his head, can hold off on head imaging.  Has been following since prior to his surgery.  Would be very careful with his controlled substances, but they are not prescribed at this clinic given that he goes to pain management.  Had offered physical therapy, he declined.  Order hospital bed per above.  Follow-up with PCP.

## 2020-03-17 NOTE — Assessment & Plan Note (Signed)
Not somnolent today on examination.  Patient reports that the appointment was scheduled by his sister who was concerned that he was somnolent.  He reports that he did have some increase somnolence immediately after surgery because he was taking Valium and Percocet.  He reports that when he does not take as much, he feels better.  He took 1 Percocet yesterday and no Valium.  No increased somnolence today or yesterday per his report.  He seems appropriate on examination today and is neurologically intact.  He is going to pain management tomorrow and plans to follow-up with his surgeon today.

## 2020-03-17 NOTE — Patient Instructions (Addendum)
Thank you for coming to see me today. It was a pleasure. Today we talked about:   I will put in an order for a hospital bed to start the process.  Please follow-up with PCP and your surgeon.    If you have any questions or concerns, please do not hesitate to call the office at [phone].  Best,   Luis Abed, DO

## 2020-03-17 NOTE — Assessment & Plan Note (Signed)
He has generalized weakness which she reports has been present since prior to his surgery.  He does have weakness on his left arm compared to his right, but he reports a history of shoulder problems on the left and needs to follow-up with with his orthopedic surgeon he states.  Overall, this is making it quite difficult for him to get out of bed and leading to frequent falls, noted below.  Offered him physical therapy, he declines at this time.  He would like to go ahead with a hospital bed, which he does seem to be a candidate for given that he is weak and having difficulty getting out of bed which has led to a fall, most recently this morning.  Will place order and contact nurse clinic.

## 2020-03-17 NOTE — Progress Notes (Signed)
SUBJECTIVE:   CHIEF COMPLAINT / HPI:   Increased Somnolence  S/P Neck Surgery 10/21 Has been taking valium and percocet Sister was concerned that he was sleepy when he was taking this Had stopped for 3 days for surgery Reports that he has been falling a lot, not hit his head For the last year has wobbled a lot, but now feels like he is falling more Took one percocet yesterday and no valium He is not doing physical therapy at the moment and he is worried   Weakness and Frequent Falls Has trouble getting in and out of bed States that this was occurring even before his neck surgery States that he has a very high bed and thinks that this is more dangerous from him He is asking if he would be a candidate for hospital bed  PERTINENT  PMH / PSH: HTN, atrial fibrillation and s/p TAVR on Eliquis, chronic diastolic CHF, COPD, hepatitis C, CKD, HLD, Polysubstance abuse  OBJECTIVE:   BP 126/60   Pulse (!) 51   Wt 233 lb 6.4 oz (105.9 kg)   SpO2 96%   BMI 31.65 kg/m    Physical Exam:  General: 69 y.o. male in NAD Neck: Anterior neck incisions well healing without obvious redness or drainage Lungs: Breathing comfortably on room air Skin: warm and dry Extremities: No edema, 4/5 strength LUE, 5/5 strength RUE, BLE, ambulates with cane Neuro: Cranial nerves II through XII grossly intact, sensation intact throughout  ASSESSMENT/PLAN:   Somnolence Not somnolent today on examination.  Patient reports that the appointment was scheduled by his sister who was concerned that he was somnolent.  He reports that he did have some increase somnolence immediately after surgery because he was taking Valium and Percocet.  He reports that when he does not take as much, he feels better.  He took 1 Percocet yesterday and no Valium.  No increased somnolence today or yesterday per his report.  He seems appropriate on examination today and is neurologically intact.  He is going to pain management tomorrow  and plans to follow-up with his surgeon today.  Weakness He has generalized weakness which she reports has been present since prior to his surgery.  He does have weakness on his left arm compared to his right, but he reports a history of shoulder problems on the left and needs to follow-up with with his orthopedic surgeon he states.  Overall, this is making it quite difficult for him to get out of bed and leading to frequent falls, noted below.  Offered him physical therapy, he declines at this time.  He would like to go ahead with a hospital bed, which he does seem to be a candidate for given that he is weak and having difficulty getting out of bed which has led to a fall, most recently this morning.  Will place order and contact nurse clinic.  Frequent falls Most recently this morning.  States that he did not hit his head, just falls onto his body.  He is on Eliquis.  He has neurologically intact on exam today and given that he has no history of hitting his head, can hold off on head imaging.  Has been following since prior to his surgery.  Would be very careful with his controlled substances, but they are not prescribed at this clinic given that he goes to pain management.  Had offered physical therapy, he declined.  Order hospital bed per above.  Follow-up with PCP.  Cleophas Dunker, [place]

## 2020-03-18 ENCOUNTER — Encounter
Payer: Medicare Other | Attending: Physical Medicine and Rehabilitation | Admitting: Physical Medicine and Rehabilitation

## 2020-03-18 ENCOUNTER — Other Ambulatory Visit: Payer: Self-pay

## 2020-03-18 ENCOUNTER — Encounter: Payer: Self-pay | Admitting: Physical Medicine and Rehabilitation

## 2020-03-18 VITALS — BP 125/90 | HR 78 | Temp 97.6°F | Ht 72.0 in | Wt 236.6 lb

## 2020-03-18 DIAGNOSIS — Z5181 Encounter for therapeutic drug level monitoring: Secondary | ICD-10-CM | POA: Diagnosis present

## 2020-03-18 DIAGNOSIS — M4802 Spinal stenosis, cervical region: Secondary | ICD-10-CM | POA: Insufficient documentation

## 2020-03-18 DIAGNOSIS — M069 Rheumatoid arthritis, unspecified: Secondary | ICD-10-CM | POA: Insufficient documentation

## 2020-03-18 DIAGNOSIS — Z79891 Long term (current) use of opiate analgesic: Secondary | ICD-10-CM | POA: Insufficient documentation

## 2020-03-18 MED ORDER — TIZANIDINE HCL 2 MG PO CAPS: 2.0000 mg | ORAL_CAPSULE | Freq: Three times a day (TID) | ORAL | 3 refills | Status: AC | PRN

## 2020-03-18 NOTE — Progress Notes (Signed)
Subjective:    Patient ID: Charles Rice, male    DOB: [birth year], 69 y.o.   MRN: [id number]  HPI  Charles Rice is a 69 year old man who presents to establish care for arthritis and neck pain s/p surgery on October 11/2 with Dr. Danielle Dess. Since his surgery he feels his neck symptoms have worsened and he regrets having surgery. He has limited range of motion of his neck (which he had before) and also has a throbbing pain at the back of his neck pain. He has taking Perocets that ran out yesterday. He also received Valium which helped him rest. The surgery was performed anteriorly and incision has been healing well. Average pain is 8/10 and it is 10/10 right now. He lives with his sister and her husband and a supportive niece.   Pain Inventory Average Pain 8 Pain Right Now 10 My pain is sharp, burning, dull and aching  In the last 24 hours, has pain interfered with the following? General activity 9 Relation with others 9 Enjoyment of life 10 What TIME of day is your pain at its worst? morning  Sleep (in general) Poor  Pain is worse with: walking, bending, inactivity, standing and some activites Pain improves with: therapy/exercise and pacing activities Relief from Meds: 9  use a cane use a walker how many minutes can you walk? 7 ability to climb steps?  no do you drive?  no  not employed: date last employed 2009 disabled: date disabled 2009 I need assistance with the following:  household duties and shopping  numbness tingling spasms dizziness  x-rays CT/MRI  Neurosurgeon Dr. Barnett Abu    Family History  Problem Relation Age of Onset  . Lung cancer Mother   . Lung cancer Father    Social History   Socioeconomic History  . Marital status: Single    Spouse name: Not on file  . Number of children: Not on file  . Years of education: Not on file  . Highest education level: Not on file  Occupational History  . Not on file  Tobacco Use  . Smoking status: Former  Smoker    Packs/day: 3.00    Types: Cigarettes    Start date: 05/1965    Quit date: 06/2007    Years since quitting: 12.7  . Smokeless tobacco: Current User  Substance and Sexual Activity  . Alcohol use: Not Currently  . Drug use: Never  . Sexual activity: Not on file  Other Topics Concern  . Not on file  Social History Narrative  . Not on file   Social Determinants of Health   Financial Resource Strain:   . Difficulty of Paying Living Expenses: Not on file  Food Insecurity:   . Worried About Programme researcher, broadcasting/film/video in the Last Year: Not on file  . Ran Out of Food in the Last Year: Not on file  Transportation Needs:   . Lack of Transportation (Medical): Not on file  . Lack of Transportation (Non-Medical): Not on file  Physical Activity:   . Days of Exercise per Week: Not on file  . Minutes of Exercise per Session: Not on file  Stress:   . Feeling of Stress : Not on file  Social Connections:   . Frequency of Communication with Friends and Family: Not on file  . Frequency of Social Gatherings with Friends and Family: Not on file  . Attends Religious Services: Not on file  . Active Member of Clubs or  Organizations: Not on file  . Attends Banker Meetings: Not on file  . Marital Status: Not on file   Past Surgical History:  Procedure Laterality Date  . BACK SURGERY    . CARDIAC SURGERY    . KNEE SURGERY     Past Medical History:  Diagnosis Date  . Aortic stenosis 2019   s/p AVR  . Benign essential HTN   . Chronic atrial fibrillation (HCC)   . Chronic diastolic CHF (congestive heart failure) (HCC)   . CKD (chronic kidney disease)   . COPD (chronic obstructive pulmonary disease) (HCC)   . Coronary artery disease    s/p CABG in [place], [place] 2019  . Hepatitis C   . Hyperlipidemia LDL goal <70   . Polysubstance abuse (HCC)   . Syncope    orthostatic hypotension in setting of dehydration from overdiuresis- Treated at [facility]   BP 125/90    Pulse 78   Temp 97.6 F (36.4 C)   Ht 6' (1.829 m)   Wt 236 lb 9.6 oz (107.3 kg)   SpO2 95%   BMI 32.09 kg/m   Opioid Risk Score:   Fall Risk Score:  `1  Depression screen PHQ 2/9  Depression screen [facility] 2/9 03/18/2020 03/17/2020 01/29/2020 01/08/2020  Decreased Interest 0 0 0 2  Down, Depressed, Hopeless 3 2 2  0  PHQ - 2 Score 3 2 2 2   Altered sleeping 3 0 2 3  Tired, decreased energy 3 1 2 3   Change in appetite 2 1 2 3   Feeling bad or failure about yourself  0 1 0 0  Trouble concentrating 3 2 3  0  Moving slowly or fidgety/restless 3 0 0 0  Suicidal thoughts 0 0 0 0  PHQ-9 Score 17 7 11 11   Difficult doing work/chores Extremely dIfficult - - -    Review of Systems  Musculoskeletal: Positive for back pain and neck pain.       Shoulder pain Elbow pain Wrist pain Foot pain   Neurological: Positive for dizziness and numbness.       Tingling  All other systems reviewed and are negative.      Objective:   Physical Exam Gen: no distress, normal appearing HEENT: oral mucosa pink and moist, NCAT Cardio: Reg rate Chest: normal effort, normal rate of breathing Abd: soft, non-distended Ext: no edema Skin: incision C/D/I, 2 large lipomas on right side of neck.  Neuro: Alert Musculoskeletal: Range of motion is only 5 degrees bilaterally. Trigger points in cervical myofascia.  Psych: pleasant, normal affect     Assessment & Plan:  Charles Rice is a 69 year old man who presents to establish care for the following:  1)Chronic Pain Syndrome secondary to diffuse pain secondary to rheumatoid arthritis.  -Discussed current symptoms of pain and history of pain.  -Discussed benefits of exercise in reducing pain. -Discussed following foods that may reduce pain: 1) Ginger 2) Blueberries 3) Salmon 4) Pumpkin seeds 5) dark chocolate 6) turmeric 7) tart cherries 8) virgin olive oil 9) chilli peppers 10) mint   2) Cervical myofascial pain syndrome following cervical spinal  surgery -Trigger point injections next visit.  -Tizanidine 2mg  TID PRN.

## 2020-03-18 NOTE — Patient Instructions (Signed)
Foods that may reduce pain: 1) Ginger 2) Blueberries 3) Salmon 4) Pumpkin seeds 5) dark chocolate 6) turmeric 7) tart cherries 8) virgin olive oil 9) chilli peppers 10) mint 11) red wine -

## 2020-03-26 ENCOUNTER — Other Ambulatory Visit: Payer: Self-pay | Admitting: Family Medicine

## 2020-03-28 ENCOUNTER — Encounter: Payer: Self-pay | Admitting: Physical Medicine and Rehabilitation

## 2020-03-28 ENCOUNTER — Encounter (HOSPITAL_BASED_OUTPATIENT_CLINIC_OR_DEPARTMENT_OTHER): Payer: Medicare Other | Admitting: Physical Medicine and Rehabilitation

## 2020-03-28 ENCOUNTER — Other Ambulatory Visit: Payer: Self-pay

## 2020-03-28 VITALS — BP 115/74 | HR 87 | Temp 98.1°F | Ht 72.0 in | Wt 232.0 lb

## 2020-03-28 DIAGNOSIS — Z79891 Long term (current) use of opiate analgesic: Secondary | ICD-10-CM

## 2020-03-28 DIAGNOSIS — M4802 Spinal stenosis, cervical region: Secondary | ICD-10-CM | POA: Diagnosis not present

## 2020-03-28 DIAGNOSIS — Z5181 Encounter for therapeutic drug level monitoring: Secondary | ICD-10-CM

## 2020-03-28 MED ORDER — AMITRIPTYLINE HCL 150 MG PO TABS: 150.0000 mg | ORAL_TABLET | Freq: Every day | ORAL | 3 refills | Status: DC

## 2020-03-28 NOTE — Patient Instructions (Signed)
Trigger Point Injection  Indication: Cervical myofascial pain not relieved by medication management and other conservative care.  Informed consent was obtained after describing risk and benefits of the procedure with the patient, this includes bleeding, bruising, infection and medication side effects.  The patient wishes to proceed and has given written consent.  The patient was placed in a seated position.  The cervical area was marked and prepped with Betadine.  It was entered with a 25-gauge 1/2 inch needle and a total of 5 mL of 1% lidocaine and normal saline was injected into a total of 3 trigger points, after negative draw back for blood.  The patient tolerated the procedure well.  Post procedure instructions were given. 

## 2020-03-28 NOTE — Progress Notes (Signed)
Trigger Point Injection  Indication: Cervical myofascial pain not relieved by medication management and other conservative care.  Informed consent was obtained after describing risk and benefits of the procedure with the patient, this includes bleeding, bruising, infection and medication side effects.  The patient wishes to proceed and has given written consent.  The patient was placed in a seated position.  The cervical area was marked and prepped with Betadine.  It was entered with a 25-gauge 1/2 inch needle and a total of 5 mL of 1% lidocaine and normal saline was injected into a total of 3 trigger points, after negative draw back for blood.  The patient tolerated the procedure well.  Post procedure instructions were given. 

## 2020-03-31 ENCOUNTER — Telehealth: Payer: Self-pay | Admitting: *Deleted

## 2020-03-31 NOTE — Telephone Encounter (Signed)
Charles Rice is calling to see if urine test has resulted yet. Please let him know when it does.

## 2020-03-31 NOTE — Telephone Encounter (Signed)
I have let him know the results are not back yet.

## 2020-04-01 ENCOUNTER — Other Ambulatory Visit: Payer: Self-pay

## 2020-04-01 ENCOUNTER — Ambulatory Visit (INDEPENDENT_AMBULATORY_CARE_PROVIDER_SITE_OTHER): Payer: Medicare Other | Admitting: Family Medicine

## 2020-04-01 ENCOUNTER — Encounter: Payer: Self-pay | Admitting: Family Medicine

## 2020-04-01 VITALS — BP 110/60 | HR 49 | Ht 72.0 in | Wt 228.0 lb

## 2020-04-01 DIAGNOSIS — I10 Essential (primary) hypertension: Secondary | ICD-10-CM | POA: Diagnosis not present

## 2020-04-01 DIAGNOSIS — N1832 Chronic kidney disease, stage 3b: Secondary | ICD-10-CM

## 2020-04-01 DIAGNOSIS — M25512 Pain in left shoulder: Secondary | ICD-10-CM | POA: Diagnosis present

## 2020-04-01 DIAGNOSIS — R7989 Other specified abnormal findings of blood chemistry: Secondary | ICD-10-CM | POA: Diagnosis not present

## 2020-04-01 DIAGNOSIS — R251 Tremor, unspecified: Secondary | ICD-10-CM

## 2020-04-01 DIAGNOSIS — N179 Acute kidney failure, unspecified: Secondary | ICD-10-CM | POA: Diagnosis not present

## 2020-04-01 DIAGNOSIS — G8929 Other chronic pain: Secondary | ICD-10-CM | POA: Diagnosis not present

## 2020-04-01 NOTE — Progress Notes (Signed)
    SUBJECTIVE:   CHIEF COMPLAINT / HPI:   Left shoulder pain Patient reports longstanding history of left shoulder pain.  He was supposed to have a surgery to replace his shoulder prior to leaving present but due to Covid the surgery was canceled.  He would like a referral to orthopedics.  Kidney concerns Patient's kidney function was decreased at the previous check.  He attributes this to the fact that he was taking large amounts of NSAIDs to help with pain.  He would like a repeat check given he has no longer taking the NSAIDs like he was before.  Denies any difficulties urinating or blood in his urine.  Concern for Parkinson's due to tremor Patient reports he has had a resting tremor for 1 year which she can get to stop if he focuses on his hands.  Patient is concerned that he is getting Parkinson's.  He knows that he follows with neurology but he has not brought it up with them.  Patient reports he is going to call his neurologist to discuss other possible causes.  OBJECTIVE:   BP 110/60   Pulse (!) 49   Ht 6' (1.829 m)   Wt 228 lb (103.4 kg)   SpO2 97%   BMI 30.92 kg/m   General: Well-appearing 69 year old male, no acute distress Cardiac: Regular rate and rhythm, no murmurs appreciated Respiratory: Normal work of breathing, lungs clear to auscultation bilaterally MSK: Patient has tremor worse in his right hand than his left.  On initial evaluation it was not noticeable but when I went to take patient his discharge papers he asked about it.  He reports if he closes fist and focuses the tremor will stop.  He has difficulty holding his phone.  Patient also has decreased range of motion in left shoulder with pain with abduction and adduction.  ASSESSMENT/PLAN:   Chronic shoulder pain Patient continues to have issues with left shoulder.  Would like a referral to orthopedics for possible shoulder surgery. -Referral placed for orthopedics to evaluate  CKD (chronic kidney  disease) Patient reports he is not sure if he has CKD but he did have an elevated creatinine at his previous check and would like it repeated to evaluate for possible worsening kidney function.  Previous creatinine was 1.65 with a GFR of 42.  Patient says that that was most likely due to the fact that he was taking large amounts of ibuprofen to help with his pain. -Repeat BMP -Avoid nephrotoxic agents -Counseled on avoiding NSAID use -Consider nephrology referral  Tremor Patient reports he has had a tremor which started approximately 1 year ago but has gotten worse recently.  On I initially saw the patient I did not notice a tremor.  When I went to take his discharge papers he questions whether the tremor is normal.  He has difficulty holding his phone but when he closes his eyes as well as closes his fists and focus he can get the tremor to stop.  He does see neurology and reports he is going to ask them about the tremor as well. -Repeat TSH showed extremely low TSH -Called patient to discuss this result and he will come to the clinic for free T3 and T4 labs -Patient will discuss this with neurology -Follow-up at next visit   Derrel Nip, MD [facility]

## 2020-04-01 NOTE — Patient Instructions (Signed)
It was great to see you today.  I am glad you are doing so much better.  I have sent a referral in for an orthopedic surgeon so that they can evaluate you for shoulder surgery.  I am also collecting lab work on you today to assess your kidney function.  I will call you with those results if there are any abnormalities.  If you have any questions, concerns, issues please feel free to call the clinic.  I hope you have a wonderful afternoon!

## 2020-04-02 ENCOUNTER — Telehealth: Payer: Self-pay | Admitting: *Deleted

## 2020-04-02 DIAGNOSIS — R251 Tremor, unspecified: Secondary | ICD-10-CM | POA: Insufficient documentation

## 2020-04-02 LAB — BASIC METABOLIC PANEL
BUN/Creatinine Ratio: 15 (ref 10–24)
BUN: 26 mg/dL (ref 8–27)
CO2: 24 mmol/L (ref 20–29)
Calcium: 9.4 mg/dL (ref 8.6–10.2)
Chloride: 99 mmol/L (ref 96–106)
Creatinine, Ser: 1.68 mg/dL — ABNORMAL HIGH (ref 0.76–1.27)
GFR calc Af Amer: 47 mL/min/{1.73_m2} — ABNORMAL LOW (ref >59)
GFR calc non Af Amer: 41 mL/min/{1.73_m2} — ABNORMAL LOW (ref >59)
Glucose: 98 mg/dL (ref 65–99)
Potassium: 3.3 mmol/L — ABNORMAL LOW (ref 3.5–5.2)
Sodium: 138 mmol/L (ref 134–144)

## 2020-04-02 LAB — TOXASSURE SELECT,+ANTIDEPR,UR

## 2020-04-02 LAB — TSH: TSH: 0.023 u[IU]/mL — ABNORMAL LOW (ref 0.450–4.500)

## 2020-04-02 NOTE — Assessment & Plan Note (Signed)
Patient continues to have issues with left shoulder.  Would like a referral to orthopedics for possible shoulder surgery. -Referral placed for orthopedics to evaluate

## 2020-04-02 NOTE — Assessment & Plan Note (Addendum)
Patient reports he is not sure if he has CKD but he did have an elevated creatinine at his previous check and would like it repeated to evaluate for possible worsening kidney function.  Previous creatinine was 1.65 with a GFR of 42.  Patient says that that was most likely due to the fact that he was taking large amounts of ibuprofen to help with his pain. -Repeat BMP -Avoid nephrotoxic agents -Counseled on avoiding NSAID use -Consider nephrology referral

## 2020-04-02 NOTE — Assessment & Plan Note (Signed)
Patient reports he has had a tremor which started approximately 1 year ago but has gotten worse recently.  On I initially saw the patient I did not notice a tremor.  When I went to take his discharge papers he questions whether the tremor is normal.  He has difficulty holding his phone but when he closes his eyes as well as closes his fists and focus he can get the tremor to stop.  He does see neurology and reports he is going to ask them about the tremor as well. -Repeat TSH showed extremely low TSH -Called patient to discuss this result and he will come to the clinic for free T3 and T4 labs -Patient will discuss this with neurology -Follow-up at next visit

## 2020-04-02 NOTE — Telephone Encounter (Signed)
Urine drug screen for this encounter is consistent for prescribed medication 

## 2020-04-03 ENCOUNTER — Other Ambulatory Visit: Payer: Self-pay | Admitting: Physical Medicine and Rehabilitation

## 2020-04-03 MED ORDER — OXYCODONE-ACETAMINOPHEN 5-325 MG PO TABS: 1.0000 | ORAL_TABLET | Freq: Three times a day (TID) | ORAL | 0 refills | Status: DC | PRN

## 2020-04-03 NOTE — Telephone Encounter (Signed)
Left VM with information per DPR.

## 2020-04-03 NOTE — Telephone Encounter (Signed)
Please let him know I have ordered three times per day as needed for now, if he needs to increase frequency in future we can, but he has not had any percocet for several days so needs to start at lower frequency, thank you!

## 2020-04-08 ENCOUNTER — Other Ambulatory Visit: Payer: Self-pay

## 2020-04-08 ENCOUNTER — Telehealth: Payer: Self-pay

## 2020-04-08 ENCOUNTER — Other Ambulatory Visit: Payer: Medicare Other

## 2020-04-08 DIAGNOSIS — R251 Tremor, unspecified: Secondary | ICD-10-CM

## 2020-04-08 DIAGNOSIS — R7989 Other specified abnormal findings of blood chemistry: Secondary | ICD-10-CM

## 2020-04-08 NOTE — Telephone Encounter (Signed)
Patient is requesting an increase in medication. He states three a day is not working.

## 2020-04-09 ENCOUNTER — Other Ambulatory Visit: Payer: Self-pay | Admitting: Physical Medicine and Rehabilitation

## 2020-04-09 ENCOUNTER — Telehealth: Payer: Self-pay | Admitting: Family Medicine

## 2020-04-09 DIAGNOSIS — E0590 Thyrotoxicosis, unspecified without thyrotoxic crisis or storm: Secondary | ICD-10-CM

## 2020-04-09 LAB — T4, FREE: Free T4: 2.87 ng/dL — ABNORMAL HIGH (ref 0.82–1.77)

## 2020-04-09 LAB — T3, FREE: T3, Free: 6.8 pg/mL — ABNORMAL HIGH (ref 2.0–4.4)

## 2020-04-09 MED ORDER — OXYCODONE-ACETAMINOPHEN 5-325 MG PO TABS: 1.0000 | ORAL_TABLET | Freq: Four times a day (QID) | ORAL | 0 refills | Status: DC | PRN

## 2020-04-09 NOTE — Telephone Encounter (Signed)
Attempted to call patient regarding lab results.  He did not answer the phone.  I have pended orders for another lab as well as a referral to endocrinology for his hyperthyroidism.  I will attempt to call him later.

## 2020-04-09 NOTE — Telephone Encounter (Signed)
I have sent an additional 30 pills so he can increase to 4 times per day

## 2020-04-09 NOTE — Telephone Encounter (Signed)
Patient calls nurse line returning PCP phone call. Patient informed of need for another lab test. Patient scheduled for 11/30 for a lab only visit. Patient advised on need for an endocrinologist, they should reach out to patient to schedule. Patient had no further questions.

## 2020-04-09 NOTE — Telephone Encounter (Signed)
Left detailed message about medication 

## 2020-04-11 ENCOUNTER — Encounter: Payer: Self-pay | Admitting: Internal Medicine

## 2020-04-11 ENCOUNTER — Ambulatory Visit (INDEPENDENT_AMBULATORY_CARE_PROVIDER_SITE_OTHER): Payer: Medicare Other | Admitting: Internal Medicine

## 2020-04-11 ENCOUNTER — Other Ambulatory Visit: Payer: Self-pay

## 2020-04-11 VITALS — BP 130/74 | HR 95 | Temp 97.7°F | Ht 73.0 in | Wt 233.0 lb

## 2020-04-11 DIAGNOSIS — J449 Chronic obstructive pulmonary disease, unspecified: Secondary | ICD-10-CM | POA: Diagnosis not present

## 2020-04-11 DIAGNOSIS — Z122 Encounter for screening for malignant neoplasm of respiratory organs: Secondary | ICD-10-CM | POA: Diagnosis not present

## 2020-04-11 DIAGNOSIS — J410 Simple chronic bronchitis: Secondary | ICD-10-CM

## 2020-04-11 LAB — PULMONARY FUNCTION TEST
DL/VA % pred: 104 %
DL/VA: 4.2 ml/min/mmHg/L
DLCO cor % pred: 73 %
DLCO cor: 20.44 ml/min/mmHg
DLCO unc % pred: 73 %
DLCO unc: 20.44 ml/min/mmHg
FEF 25-75 Post: 1.55 L/sec
FEF 25-75 Pre: 1.3 L/sec
FEF2575-%Change-Post: 19 %
FEF2575-%Pred-Post: 57 %
FEF2575-%Pred-Pre: 47 %
FEV1-%Change-Post: 10 %
FEV1-%Pred-Post: 69 %
FEV1-%Pred-Pre: 62 %
FEV1-Post: 2.46 L
FEV1-Pre: 2.22 L
FEV1FVC-%Change-Post: 4 %
FEV1FVC-%Pred-Pre: 88 %
FEV6-%Change-Post: 3 %
FEV6-%Pred-Post: 77 %
FEV6-%Pred-Pre: 75 %
FEV6-Post: 3.53 L
FEV6-Pre: 3.42 L
FEV6FVC-%Change-Post: -2 %
FEV6FVC-%Pred-Post: 103 %
FEV6FVC-%Pred-Pre: 105 %
FVC-%Change-Post: 5 %
FVC-%Pred-Post: 75 %
FVC-%Pred-Pre: 71 %
FVC-Post: 3.62 L
FVC-Pre: 3.42 L
Post FEV1/FVC ratio: 68 %
Post FEV6/FVC ratio: 98 %
Pre FEV1/FVC ratio: 65 %
Pre FEV6/FVC Ratio: 100 %
RV % pred: 92 %
RV: 2.35 L
TLC % pred: 74 %
TLC: 5.51 L

## 2020-04-11 NOTE — Progress Notes (Signed)
YONAH TANGEMAN    [id number]    [birth year]  Primary Care Physician:Cresenzo, Alecia Lemming, MD Date of Appointment: 04/11/2020 Established Patient Visit  Chief complaint:   Chief Complaint  Patient presents with  . Follow-up    follow up after PFT     HPI: ISADOR CASTILLE is a 69 y.o. gentleman with COPD who established care with me in October 2021.   Interval Updates: Here for today for follow up after PFTs. Was switched to Anoro at last visit. He forgets it occasionally but does think it's helping. Using albuterol less frequently. He did have overnight oximetry.   I have reviewed the patient's family social and past medical history and updated as appropriate.   Past Medical History:  Diagnosis Date  . Aortic stenosis 2019   s/p AVR  . Benign essential HTN   . Chronic atrial fibrillation (HCC)   . Chronic diastolic CHF (congestive heart failure) (HCC)   . CKD (chronic kidney disease)   . COPD (chronic obstructive pulmonary disease) (HCC)   . Coronary artery disease    s/p CABG in [place], [place] 2019  . Hepatitis C   . Hyperlipidemia LDL goal <70   . Polysubstance abuse (HCC)   . Syncope    orthostatic hypotension in setting of dehydration from overdiuresis- Treated at [facility]    Past Surgical History:  Procedure Laterality Date  . BACK SURGERY    . CARDIAC SURGERY    . KNEE SURGERY      Family History  Problem Relation Age of Onset  . Lung cancer Mother   . Lung cancer Father     Social History   Occupational History  . Not on file  Tobacco Use  . Smoking status: Former Smoker    Packs/day: 3.00    Types: Cigarettes    Start date: 05/1965    Quit date: 06/2007    Years since quitting: 12.8  . Smokeless tobacco: Current User  Substance and Sexual Activity  . Alcohol use: Not Currently  . Drug use: Never  . Sexual activity: Not on file     Physical Exam: Blood pressure 130/74, pulse 95, temperature 97.7 F (36.5 C),  temperature source Tympanic, height 6\' 1"  (1.854 m), weight 233 lb (105.7 kg), SpO2 97 %.  Gen:      No acute distress Lungs:    Diminished, No increased respiratory effort, symmetric chest wall excursion, clear to auscultation bilaterally, no wheezes or crackles CV:         Regular rate and rhythm; no murmurs, rubs, or gallops.  No pedal edema   Data Reviewed: Imaging: no chest imaging PFTs:  PFT Results Latest Ref Rng & Units 04/11/2020  FVC-Pre L 3.42  FVC-Predicted Pre % 71  FVC-Post L 3.62  FVC-Predicted Post % 75  Pre FEV1/FVC % % 65  Post FEV1/FCV % % 68  FEV1-Pre L 2.22  FEV1-Predicted Pre % 62  FEV1-Post L 2.46  DLCO uncorrected ml/min/mmHg 20.44  DLCO UNC% % 73  DLCO corrected ml/min/mmHg 20.44  DLCO COR %Predicted % 73  DLVA Predicted % 104  TLC L 5.51  TLC % Predicted % 74  RV % Predicted % 92   I have personally reviewed the patient's PFTs and there is moderate airflow limitation with an FEV1 62% of predicted. Lung volumes show moderate restriction to ventilation and diffusion capacity is normal.   Labs: Lab Results  Component Value Date  WBC 11.2 (H) 01/08/2020   HGB 16.0 01/08/2020   HCT 49.5 01/08/2020   MCV 85 01/08/2020   PLT 175 01/08/2020    Immunization status: Immunization History  Administered Date(s) Administered  . Moderna SARS-COVID-2 Vaccination 06/14/2019, 07/11/2019  . Pneumococcal Polysaccharide-23 01/29/2020     Assessment:  COPD moderate with an FEV1 62% of predicted 120 pack year smoking history quit 2010  CAD s/p CABG  Plan/Recommendations: SIGMUND MORERA is a 69 y.o. gentleman with COPD who presents with progressive symptoms. Disease moderate based on PFTs. Continue Anoro and prn albuterol. He had overnight oximetry study but will contact [facility] to obtain results.    Return to Care: Return in about 4 months (around 08/09/2020).   Durel Salts, MD Pulmonary and Critical Care Medicine [facility] Office:[phone]

## 2020-04-11 NOTE — Progress Notes (Signed)
Full PFT performed today. °

## 2020-04-11 NOTE — Patient Instructions (Signed)
The patient should have follow up scheduled with myself in 4 months.  Referral for lung cancer screening program.   Take the albuterol rescue inhaler every 4 to 6 hours as needed for wheezing or shortness of breath. You can also take it 15 minutes before exercise or exertional activity. Side effects include heart racing or pounding, jitters or anxiety. If you have a history of an irregular heart rhythm, it can make this worse. Can also give some patients a hard time sleeping.  To inhale the aerosol using an inhaler, follow these steps:  1. Remove the protective dust cap from the end of the mouthpiece. If the dust cap was not placed on the mouthpiece, check the mouthpiece for dirt or other objects. Be sure that the canister is fully and firmly inserted in the mouthpiece. 2. If you are using the inhaler for the first time or if you have not used the inhaler in more than 14 days, you will need to prime it. You may also need to prime the inhaler if it has been dropped. Ask your pharmacist or check the manufacturer's information if this happens. To prime the inhaler, shake it well and then press down on the canister 4 times to release 4 sprays into the air, away from your face. Be careful not to get albuterol in your eyes. 3. Shake the inhaler well. 4. Breathe out as completely as possible through your mouth. 4. Hold the canister with the mouthpiece on the bottom, facing you and the canister pointing upward. Place the open end of the mouthpiece into your mouth. Close your lips tightly around the mouthpiece. 6. Breathe in slowly and deeply through the mouthpiece.At the same time, press down once on the container to spray the medication into your mouth. 7. Try to hold your breath for 10 seconds. remove the inhaler, and breathe out slowly. 8. If you were told to use 2 puffs, wait 1 minute and then repeat steps 3-7. 9. Replace the protective cap on the inhaler. 10. Clean your inhaler regularly. Follow the  manufacturer's directions carefully and ask your doctor or pharmacist if you have any questions about cleaning your inhaler.  Check the back of the inhaler to keep track of the total number of doses left on the inhaler.

## 2020-04-15 ENCOUNTER — Other Ambulatory Visit: Payer: Medicare Other

## 2020-04-16 ENCOUNTER — Telehealth: Payer: Self-pay | Admitting: Internal Medicine

## 2020-04-16 NOTE — Telephone Encounter (Signed)
Spoke with patient.  He states while he was incarcerated he had this wedge he would sleep with that helped him. I asked him if he has looked on [employer] to see if they had anything as the patient would know what it looked like.  He said he hadn't thought of that.  He states he is going to check into that. I will route to Dr. Celine Mans as Lorain Childes and see if she has any other options.

## 2020-04-17 ENCOUNTER — Ambulatory Visit (INDEPENDENT_AMBULATORY_CARE_PROVIDER_SITE_OTHER): Payer: Medicare Other | Admitting: Orthopaedic Surgery

## 2020-04-17 ENCOUNTER — Other Ambulatory Visit: Payer: Medicare Other

## 2020-04-17 ENCOUNTER — Ambulatory Visit (INDEPENDENT_AMBULATORY_CARE_PROVIDER_SITE_OTHER): Payer: Medicare Other

## 2020-04-17 ENCOUNTER — Encounter: Payer: Self-pay | Admitting: Orthopaedic Surgery

## 2020-04-17 ENCOUNTER — Other Ambulatory Visit: Payer: Self-pay

## 2020-04-17 DIAGNOSIS — I2510 Atherosclerotic heart disease of native coronary artery without angina pectoris: Secondary | ICD-10-CM

## 2020-04-17 DIAGNOSIS — M25512 Pain in left shoulder: Secondary | ICD-10-CM

## 2020-04-17 DIAGNOSIS — G8929 Other chronic pain: Secondary | ICD-10-CM

## 2020-04-17 DIAGNOSIS — E0590 Thyrotoxicosis, unspecified without thyrotoxic crisis or storm: Secondary | ICD-10-CM

## 2020-04-17 MED ORDER — METOPROLOL TARTRATE 25 MG PO TABS
25.0000 mg | ORAL_TABLET | Freq: Two times a day (BID) | ORAL | 0 refills | Status: DC
Start: 2020-04-17 — End: 2020-07-10

## 2020-04-17 NOTE — Progress Notes (Signed)
Office Visit Note   Patient: Charles Rice           Date of Birth: 1951/04/08           MRN: [id number] Visit Date: 04/17/2020              Requested by: Westley Chandler, MD [address] [place],  [place] [address] PCP: Derrel Nip, MD   Assessment & Plan: Visit Diagnoses:  1. Chronic left shoulder pain     Plan: Impression is left shoulder rotator cuff arthropathy and end-stage DJD.  We will make a referral to Dr. August Saucer for surgical consultation.  Follow-Up Instructions: Return for needs appt with Dr. August Saucer ASAP for left shoulder replacement.   Orders:  Orders Placed This Encounter  Procedures  . XR Shoulder Left   No orders of the defined types were placed in this encounter.     Procedures: No procedures performed   Clinical Data: No additional findings.   Subjective: Chief Complaint  Patient presents with  . Left Shoulder - Pain    Charles Rice is a very pleasant 69 year old gentleman who comes in for evaluation of chronic left shoulder pain.  He was originally scheduled for a reverse shoulder replacement about 2 years ago in [place] but everything was canceled due to Covid.  He has since relocated to [facility] would like to resume discussion for shoulder replacement.  He has undergone extensive conservative treatment at the previous doctors office in terms of medications, injections.  These have failed to provide him with any significant relief.  He has severe difficulty in ADLs.  He is unable to scratch the top of his head due to pain and limitation range of motion.   Review of Systems  Constitutional: Negative.   All other systems reviewed and are negative.    Objective: Vital Signs: There were no vitals taken for this visit.  Physical Exam Vitals and nursing note reviewed.  Constitutional:      Appearance: He is well-developed.  Pulmonary:     Effort: Pulmonary effort is normal.  Abdominal:     Palpations: Abdomen is soft.  Skin:     General: Skin is warm.  Neurological:     Mental Status: He is alert and oriented to person, place, and time.  Psychiatric:        Behavior: Behavior normal.        Thought Content: Thought content normal.        Judgment: Judgment normal.     Ortho Exam Left shoulder shows moderate limitation in range of motion with forward flexion and external rotation all with severe pain.  Rotator cuff appears to be deficient based on my examination. Specialty Comments:  No specialty comments available.  Imaging: XR Shoulder Left  Result Date: 04/17/2020 Advanced glenohumeral DJD.    PMFS History: Patient Active Problem List   Diagnosis Date Noted  . Tremor 04/02/2020  . Somnolence 03/17/2020  . Frequent falls 03/17/2020  . Weakness 03/17/2020  . CKD (chronic kidney disease)   . Chronic diastolic CHF (congestive heart failure) (HCC)   . Hepatitis C   . Polysubstance abuse (HCC)   . Syncope   . Hyperlipidemia LDL goal <70   . Chronic shoulder pain   . Atrial fibrillation (HCC)   . Encounter to establish care 01/08/2020  . Depression 01/08/2020  . Hypertension 01/08/2020  . Chronic pain 01/08/2020  . COPD (chronic obstructive pulmonary disease) (HCC) 01/08/2020  . S/P AVR (aortic  valve replacement) 2019   Past Medical History:  Diagnosis Date  . Aortic stenosis 2019   s/p AVR  . Benign essential HTN   . Chronic atrial fibrillation (HCC)   . Chronic diastolic CHF (congestive heart failure) (HCC)   . CKD (chronic kidney disease)   . COPD (chronic obstructive pulmonary disease) (HCC)   . Coronary artery disease    s/p CABG in [place], [place] 2019  . Hepatitis C   . Hyperlipidemia LDL goal <70   . Polysubstance abuse (HCC)   . Syncope    orthostatic hypotension in setting of dehydration from overdiuresis- Treated at [facility]    Family History  Problem Relation Age of Onset  . Lung cancer Mother   . Lung cancer Father     Past Surgical History:  Procedure  Laterality Date  . BACK SURGERY    . CARDIAC SURGERY    . KNEE SURGERY     Social History   Occupational History  . Not on file  Tobacco Use  . Smoking status: Former Smoker    Packs/day: 3.00    Types: Cigarettes    Start date: 05/1965    Quit date: 06/2007    Years since quitting: 12.8  . Smokeless tobacco: Current User  Substance and Sexual Activity  . Alcohol use: Not Currently  . Drug use: Never  . Sexual activity: Not on file

## 2020-04-18 NOTE — Addendum Note (Signed)
Addended by: Celedonio Savage on: 04/18/2020 10:34 AM   Modules accepted: Orders

## 2020-04-19 ENCOUNTER — Other Ambulatory Visit: Payer: Self-pay | Admitting: Family Medicine

## 2020-04-19 LAB — THYROTROPIN RECEPTOR AUTOABS: Thyrotropin Receptor Ab: <1.1 IU/L (ref 0.00–1.75)

## 2020-04-25 ENCOUNTER — Ambulatory Visit (INDEPENDENT_AMBULATORY_CARE_PROVIDER_SITE_OTHER): Payer: Medicare Other | Admitting: Orthopedic Surgery

## 2020-04-25 DIAGNOSIS — I2510 Atherosclerotic heart disease of native coronary artery without angina pectoris: Secondary | ICD-10-CM | POA: Diagnosis not present

## 2020-04-25 DIAGNOSIS — M25512 Pain in left shoulder: Secondary | ICD-10-CM

## 2020-04-25 DIAGNOSIS — M19012 Primary osteoarthritis, left shoulder: Secondary | ICD-10-CM

## 2020-04-25 DIAGNOSIS — G8929 Other chronic pain: Secondary | ICD-10-CM

## 2020-04-26 ENCOUNTER — Encounter: Payer: Self-pay | Admitting: Orthopedic Surgery

## 2020-04-26 ENCOUNTER — Other Ambulatory Visit: Payer: Self-pay | Admitting: Family Medicine

## 2020-04-26 NOTE — Progress Notes (Signed)
Office Visit Note   Patient: Charles Rice           Date of Birth: 1951-04-04           MRN: [id number] Visit Date: 04/25/2020 Requested by: Derrel Nip, MD [address] [place],  [place] [address] PCP: Derrel Nip, MD  Subjective: Chief Complaint  Patient presents with  . Pain    HPI: Charles Rice is a 69 year old patient with left shoulder pain. He has had pain for 2 years. Denies any history of injury. He is left-hand dominant. He also has multiple other medical problems including atrial fibrillation for which she is on Eliquis. He also has hepatitis C. Lives with his sister and brother-in-law. He is in a pain clinic and takes 4 Percocet tablets per day. He was scheduled for shoulder replacement in [facility] but Covid pandemic interfered with that plan. He is undergone extensive conservative treatment in terms of medication injections which have failed to provide him with any significant relief. He describes severe difficulty with ADLs. He is planning on eye surgery January 31. He has been off his Eliquis in the past for other joint replacements.              ROS: All systems reviewed are negative as they relate to the chief complaint within the history of present illness.  Patient denies  fevers or chills.   Assessment & Plan: Visit Diagnoses:  1. Chronic left shoulder pain     Plan: Impression is severe end-stage left shoulder arthritis with likely glenoid deformity. Patient has multiple other medical problems. He will need cardiac risk ratification prior to surgery. Thin cut CT scan left shoulder to evaluate glenoid deformity as well as for preop planning purposes. Would like to get this surgery done sometime either early in January prior to his eye surgery or potentially 7 to 10 days after his eye surgery. Risk and benefits of the procedure are discussed with the patient including not limited to infection nerve vessel damage dislocation incomplete pain relief incomplete  functional restoration as well as potential need for more surgery. All questions answered.  Follow-Up Instructions: No follow-ups on file.   Orders:  Orders Placed This Encounter  Procedures  . CT SHOULDER LEFT WO CONTRAST   No orders of the defined types were placed in this encounter.     Procedures: No procedures performed   Clinical Data: No additional findings.  Objective: Vital Signs: There were no vitals taken for this visit.  Physical Exam:   Constitutional: Patient appears well-developed HEENT:  Head: Normocephalic Eyes:EOM are normal Neck: Normal range of motion Cardiovascular: Normal rate Pulmonary/chest: Effort normal Neurologic: Patient is alert Skin: Skin is warm Psychiatric: Patient has normal mood and affect    Ortho Exam: Ortho exam demonstrates full active and passive range of motion of the cervical spine. Left shoulder has severely limited forward flexion abduction both well below 90 degrees. Rotator cuff strength pretty reasonable to external rotation as well as subscap testing. He only has about 25 degrees of external rotation of 15 degrees of abduction on the left-hand side. Motor or sensory function to the left hand is intact. Deltoid does fire.  Specialty Comments:  No specialty comments available.  Imaging: No results found.   PMFS History: Patient Active Problem List   Diagnosis Date Noted  . Tremor 04/02/2020  . Somnolence 03/17/2020  . Frequent falls 03/17/2020  . Weakness 03/17/2020  . CKD (chronic kidney disease)   . Chronic  diastolic CHF (congestive heart failure) (HCC)   . Hepatitis C   . Polysubstance abuse (HCC)   . Syncope   . Hyperlipidemia LDL goal <70   . Chronic shoulder pain   . Atrial fibrillation (HCC)   . Encounter to establish care 01/08/2020  . Depression 01/08/2020  . Hypertension 01/08/2020  . Chronic pain 01/08/2020  . COPD (chronic obstructive pulmonary disease) (HCC) 01/08/2020  . S/P AVR (aortic valve  replacement) 2019   Past Medical History:  Diagnosis Date  . Aortic stenosis 2019   s/p AVR  . Benign essential HTN   . Chronic atrial fibrillation (HCC)   . Chronic diastolic CHF (congestive heart failure) (HCC)   . CKD (chronic kidney disease)   . COPD (chronic obstructive pulmonary disease) (HCC)   . Coronary artery disease    s/p CABG in [place], [place] 2019  . Hepatitis C   . Hyperlipidemia LDL goal <70   . Polysubstance abuse (HCC)   . Syncope    orthostatic hypotension in setting of dehydration from overdiuresis- Treated at [facility]    Family History  Problem Relation Age of Onset  . Lung cancer Mother   . Lung cancer Father     Past Surgical History:  Procedure Laterality Date  . BACK SURGERY    . CARDIAC SURGERY    . KNEE SURGERY     Social History   Occupational History  . Not on file  Tobacco Use  . Smoking status: Former Smoker    Packs/day: 3.00    Types: Cigarettes    Start date: 05/1965    Quit date: 06/2007    Years since quitting: 12.8  . Smokeless tobacco: Current User  Substance and Sexual Activity  . Alcohol use: Not Currently  . Drug use: Never  . Sexual activity: Not on file

## 2020-04-28 ENCOUNTER — Telehealth: Payer: Self-pay | Admitting: Acute Care

## 2020-04-28 ENCOUNTER — Other Ambulatory Visit: Payer: Self-pay | Admitting: Physical Medicine and Rehabilitation

## 2020-04-28 ENCOUNTER — Telehealth: Payer: Self-pay

## 2020-04-28 DIAGNOSIS — Z87891 Personal history of nicotine dependence: Secondary | ICD-10-CM

## 2020-04-28 MED ORDER — OXYCODONE-ACETAMINOPHEN 5-325 MG PO TABS: 1.0000 | ORAL_TABLET | Freq: Four times a day (QID) | ORAL | 0 refills | Status: DC | PRN

## 2020-04-28 NOTE — Telephone Encounter (Signed)
Spoke with pt and scheduled [facility] 06/04/20 9:30 CT ordered Nothing further needed

## 2020-04-28 NOTE — Telephone Encounter (Signed)
Mr. Magan called: He will be out of Oxycodone 5-325 on this Friday. Patient wanted to make sure his refill be ready. Call back ph [phone].  Thank you

## 2020-04-28 NOTE — Telephone Encounter (Signed)
Please let him know I have sent refill with Friday release date. Please let him know medication is for 1 month's supply so to use exactly as prescribed as early refills are not permitted by physician contract/pharmacy. Thank you!

## 2020-04-29 NOTE — Telephone Encounter (Signed)
Patient advised.

## 2020-04-30 ENCOUNTER — Other Ambulatory Visit: Payer: Self-pay | Admitting: Family Medicine

## 2020-04-30 NOTE — Progress Notes (Signed)
Name: Charles Rice  MRN/ DOB: [id number], [birth year]    Age/ Sex: 69 y.o., male    PCP: Derrel Nip, MD   Reason for Endocrinology Evaluation: Hyperthyroidism     Date of Initial Endocrinology Evaluation: 05/01/2020     HPI: Charles Rice is a 69 y.o. male with a past medical history of HTN, A. Fib, CHF , COPD and S/P AVR . The patient presented for initial endocrinology clinic visit on 05/01/2020 for consultative assistance with his Hyperthyroidism      Pt has been noted with low TSH at 0.023 uIU/mL on 04/01/2020 with elevated FT4 2.87 ng/dL and free T3 pg/mL during evaluation for tremors.    He believes he was on Amiodarone but has been off for the past year   He has had intentional weight loss  Tremors started 1 yr ago and has been worsening  Denies diarrhea  Has occasional  palpitations      No FH of thyroid disease    He has been on prednisone every other day for 3 years for COPD    HISTORY:  Past Medical History:  Past Medical History:  Diagnosis Date  . Aortic stenosis 2019   s/p AVR  . Benign essential HTN   . Chronic atrial fibrillation (HCC)   . Chronic diastolic CHF (congestive heart failure) (HCC)   . CKD (chronic kidney disease)   . COPD (chronic obstructive pulmonary disease) (HCC)   . Coronary artery disease    s/p CABG in [place], [place] 2019  . Hepatitis C   . Hyperlipidemia LDL goal <70   . Polysubstance abuse (HCC)   . Syncope    orthostatic hypotension in setting of dehydration from overdiuresis- Treated at [facility]   Past Surgical History:  Past Surgical History:  Procedure Laterality Date  . BACK SURGERY    . CARDIAC SURGERY    . KNEE SURGERY        Social History:  reports that he quit smoking about 12 years ago. His smoking use included cigarettes. He started smoking about 54 years ago. He smoked 3.00 packs per day. He uses smokeless tobacco. He reports previous alcohol use. He reports that he does  not use drugs.  Family History: family history includes Lung cancer in his father and mother.   HOME MEDICATIONS: Allergies as of 05/01/2020   No Known Allergies     Medication List       Accurate as of May 01, 2020 11:44 AM. If you have any questions, ask your nurse or doctor.        albuterol 108 (90 Base) MCG/ACT inhaler Commonly known as: VENTOLIN HFA Inhale 2 puffs into the lungs every 6 (six) hours as needed.   amitriptyline 150 MG tablet Commonly known as: ELAVIL Take 1 tablet (150 mg total) by mouth at bedtime.   Anoro Ellipta 62.5-25 MCG/INH Aepb Generic drug: umeclidinium-vilanterol Inhale 1 puff into the lungs daily.   apixaban 5 MG Tabs tablet Commonly known as: ELIQUIS Take 1 tablet (5 mg total) by mouth 2 (two) times daily.   atorvastatin 20 MG tablet Commonly known as: LIPITOR Take 1 tablet (20 mg total) by mouth daily.   Atrovent HFA 17 MCG/ACT inhaler Generic drug: ipratropium Inhale 2 puffs into the lungs every 6 (six) hours as needed for wheezing.   diazepam 5 MG tablet Commonly known as: VALIUM Take 5 mg by mouth every 6 (six) hours as needed for anxiety.  ferrous sulfate 325 (65 FE) MG tablet TAKE 1 TABLET BY MOUTH EVERY OTHER DAY   furosemide 80 MG tablet Commonly known as: LASIX TAKE 2 TABLETS BY MOUTH EVERY MORNING AND 1 TABLET EVERY EVENING   magnesium oxide 400 (241.3 Mg) MG tablet Commonly known as: MAG-OX TAKE 1 TABLET BY MOUTH EVERY DAY   methocarbamol 500 MG tablet Commonly known as: ROBAXIN Take 1 tablet (500 mg total) by mouth every 8 (eight) hours as needed for muscle spasms.   metoprolol tartrate 25 MG tablet Commonly known as: LOPRESSOR Take 1 tablet (25 mg total) by mouth 2 (two) times daily.   oxyCODONE-acetaminophen 5-325 MG tablet Commonly known as: PERCOCET/ROXICET Take 1 tablet by mouth 4 (four) times daily as needed. Start taking on: May 02, 2020   potassium chloride 10 MEQ tablet Commonly known  as: KLOR-CON TAKE 1 TABLET BY MOUTH EVERY DAY   predniSONE 10 MG tablet Commonly known as: DELTASONE TAKE 1 TABLET BY MOUTH EVERY OTHER DAY   spironolactone 25 MG tablet Commonly known as: ALDACTONE TAKE 1 TABLET BY MOUTH EVERY DAY   tizanidine 2 MG capsule Commonly known as: ZANAFLEX Take 1 capsule (2 mg total) by mouth 3 (three) times daily as needed for muscle spasms.   topiramate 100 MG tablet Commonly known as: TOPAMAX TAKE 1 TABLET BY MOUTH EVERY DAY   traMADol 50 MG tablet Commonly known as: ULTRAM Take 50-100 mg by mouth 4 (four) times daily as needed.         REVIEW OF SYSTEMS: A comprehensive ROS was conducted with the patient and is negative except as per HPI    OBJECTIVE:  VS: BP 130/76   Pulse 78   Ht  (1.854 m)   Wt 226 lb 4 oz (102.6 kg)   SpO2 98%   BMI 29.85 kg/m    Wt Readings from Last 3 Encounters:  05/01/20 226 lb 4 oz (102.6 kg)  04/11/20 233 lb (105.7 kg)  04/01/20 228 lb (103.4 kg)     EXAM: General: Pt appears well and is in NAD  Neck: General: Supple without adenopathy. Thyroid: Thyroid size normal.  No goiter or nodules appreciated.  Lungs: Clear with good BS bilat with no rales, rhonchi, or wheezes  Heart: Auscultation: RRR.  Abdomen: Normoactive bowel sounds, soft, nontender, without masses or organomegaly palpable  Extremities:  BL LE: No pretibial edema normal ROM and strength.  Skin: Hair: Texture and amount normal with gender appropriate distribution Skin Inspection: No rashes Skin Palpation: Skin temperature, texture, and thickness normal to palpation  Neuro: Cranial nerves: II - XII grossly intact  Motor: Normal strength throughout DTRs: 2+ and symmetric in UE without delay in relaxation phase  Mental Status: Judgment, insight: Intact Orientation: Oriented to time, place, and person Mood and affect: No depression, anxiety, or agitation     DATA REVIEWED:   Results for Charles Rice, Charles "EDDIE" (MRN [id number])  as of 05/02/2020 07:59  Ref. Range 05/01/2020 11:50  Sodium Latest Ref Range: 135 - 145 mEq/L 136  Potassium Latest Ref Range: 3.5 - 5.1 mEq/L 3.5  Chloride Latest Ref Range: 96 - 112 mEq/L 103  CO2 Latest Ref Range: 19 - 32 mEq/L 23  Glucose Latest Ref Range: 70 - 99 mg/dL 409 (H)  BUN Latest Ref Range: 6 - 23 mg/dL 23  Creatinine Latest Ref Range: 0.40 - 1.50 mg/dL 8.11  Calcium Latest Ref Range: 8.4 - 10.5 mg/dL 9.4  Alkaline Phosphatase Latest Ref Range: 39 -  117 U/L 98  Albumin Latest Ref Range: 3.5 - 5.2 g/dL 3.9  AST Latest Ref Range: 0 - 37 U/L 14  ALT Latest Ref Range: 0 - 53 U/L 8  Total Protein Latest Ref Range: 6.0 - 8.3 g/dL 7.6  Total Bilirubin Latest Ref Range: 0.2 - 1.2 mg/dL 0.8  GFR Latest Ref Range: >60.00 mL/min 52.21 (L)  WBC Latest Ref Range: 4.0 - 10.5 K/uL 8.9  RBC Latest Ref Range: 4.22 - 5.81 Mil/uL 5.99 (H)  Hemoglobin Latest Ref Range: 13.0 - 17.0 g/dL 16.1  HCT Latest Ref Range: 39.0 - 52.0 % 48.4  MCV Latest Ref Range: 78.0 - 100.0 fl 80.8  MCHC Latest Ref Range: 30.0 - 36.0 g/dL 09.6  RDW Latest Ref Range: 11.5 - 15.5 % 16.5 (H)  Platelets Latest Ref Range: 150.0 - 400.0 K/uL 193.0  Neutrophils Latest Ref Range: 43.0 - 77.0 % 69.5  Lymphocytes Latest Ref Range: 12.0 - 46.0 % 16.7  Monocytes Relative Latest Ref Range: 3.0 - 12.0 % 11.7  Eosinophil Latest Ref Range: 0.0 - 5.0 % 1.9  Basophil Latest Ref Range: 0.0 - 3.0 % 0.2  NEUT# Latest Ref Range: 1.4 - 7.7 K/uL 6.2  Lymphocyte # Latest Ref Range: 0.7 - 4.0 K/uL 1.5  Monocyte # Latest Ref Range: 0.1 - 1.0 K/uL 1.0  Eosinophils Absolute Latest Ref Range: 0.0 - 0.7 K/uL 0.2  Basophils Absolute Latest Ref Range: 0.0 - 0.1 K/uL 0.0  TSH Latest Ref Range: 0.35 - 4.50 uIU/mL <0.01 Repeated and verified X2. (L)  Triiodothyronine (T3) Latest Ref Range: 76 - 181 ng/dL 045 (H)  W0,JWJX(BJYNWG) Latest Ref Range: 0.60 - 1.60 ng/dL 9.56 (H)    ASSESSMENT/PLAN/RECOMMENDATIONS:   1. Hyperthyroidism:  - Pt  with tremors  - No local neck symptoms  - He is not sure if he was on Amiodarone or not in the past, he was on "heart medicine " during incarceration " and believes it may be amiodarone , but he also believes he has been off the "heart medicine" for over a year  - Discussed D/D of graves' disease, toxic nodule(s) , thyroiditis  -We discussed that Graves' Disease is a result of an autoimmune condition involving the thyroid.    We discussed with pt the benefits of methimazole in the Tx of hyperthyroidism, as well as the possible side effects/complications of anti-thyroid drug Tx (specifically detailing the rare, but serious side effect of agranulocytosis). He was informed of need for regular thyroid function monitoring while on methimazole to ensure appropriate dosage without over-treatment. As well, we discussed the possible side effects of methimazole including the chance of rash, the small chance of liver irritation/juandice and the <=1 in 300-400 chance of sudden onset agranulocytosis.  We discussed importance of going to ED promptly (and stopping methimazole) if hewere to develop significant fever with severe sore throat of other evidence of acute infection.     We extensively discussed the various treatment options for hyperthyroidism and Graves disease including ablation therapy with radioactive iodine versus antithyroid drug treatment versus surgical therapy.  We recommended to the patient that we felt, at this time, that thionamide therapy would be most optimal.  We discussed the various possible benefits versus side effects of the various therapies.   I carefully explained to the patient that one of the consequences of I-131 ablation treatment would likely be permanent hypothyroidism which would require long-term replacement therapy with LT4.   Medications :  Methimazole , 1 tablet BID  Signed electronically by: Lyndle Herrlich, MD  [facility] Endocrinology  [facility] [address] [place], [place] [address] Phone: [phone] FAX: [phone]   CC: Derrel Nip, MD [address] [place] [address] Phone: [phone] Fax: [phone]   Return to Endocrinology clinic as below: Future Appointments  Date Time Provider Department Center  05/14/2020 11:00 AM Raulkar, Drema Pry, MD [facility] CPR  05/15/2020  8:30 AM GI-315 CT 1 GI-315CT [facility]  06/04/2020  9:30 AM Bevelyn Ngo, NP [facility] None  06/04/2020 10:40 AM GI-315 CT 1 GI-315CT [facility]  07/16/2020 10:20 AM Quintella Reichert, MD [facility]

## 2020-05-01 ENCOUNTER — Encounter: Payer: Self-pay | Admitting: Internal Medicine

## 2020-05-01 ENCOUNTER — Other Ambulatory Visit: Payer: Self-pay

## 2020-05-01 ENCOUNTER — Ambulatory Visit (INDEPENDENT_AMBULATORY_CARE_PROVIDER_SITE_OTHER): Payer: Medicare Other | Admitting: Internal Medicine

## 2020-05-01 VITALS — BP 130/76 | HR 78 | Ht 73.0 in | Wt 226.2 lb

## 2020-05-01 DIAGNOSIS — E0590 Thyrotoxicosis, unspecified without thyrotoxic crisis or storm: Secondary | ICD-10-CM

## 2020-05-01 LAB — COMPREHENSIVE METABOLIC PANEL
ALT: 8 U/L (ref 0–53)
AST: 14 U/L (ref 0–37)
Albumin: 3.9 g/dL (ref 3.5–5.2)
Alkaline Phosphatase: 98 U/L (ref 39–117)
BUN: 23 mg/dL (ref 6–23)
CO2: 23 mEq/L (ref 19–32)
Calcium: 9.4 mg/dL (ref 8.4–10.5)
Chloride: 103 mEq/L (ref 96–112)
Creatinine, Ser: 1.38 mg/dL (ref 0.40–1.50)
GFR: 52.21 mL/min — ABNORMAL LOW (ref >60.00)
Glucose, Bld: 110 mg/dL — ABNORMAL HIGH (ref 70–99)
Potassium: 3.5 mEq/L (ref 3.5–5.1)
Sodium: 136 mEq/L (ref 135–145)
Total Bilirubin: 0.8 mg/dL (ref 0.2–1.2)
Total Protein: 7.6 g/dL (ref 6.0–8.3)

## 2020-05-01 LAB — CBC WITH DIFFERENTIAL/PLATELET
Basophils Absolute: 0 10*3/uL (ref 0.0–0.1)
Basophils Relative: 0.2 % (ref 0.0–3.0)
Eosinophils Absolute: 0.2 10*3/uL (ref 0.0–0.7)
Eosinophils Relative: 1.9 % (ref 0.0–5.0)
HCT: 48.4 % (ref 39.0–52.0)
Hemoglobin: 15.9 g/dL (ref 13.0–17.0)
Lymphocytes Relative: 16.7 % (ref 12.0–46.0)
Lymphs Abs: 1.5 10*3/uL (ref 0.7–4.0)
MCHC: 32.8 g/dL (ref 30.0–36.0)
MCV: 80.8 fl (ref 78.0–100.0)
Monocytes Absolute: 1 10*3/uL (ref 0.1–1.0)
Monocytes Relative: 11.7 % (ref 3.0–12.0)
Neutro Abs: 6.2 10*3/uL (ref 1.4–7.7)
Neutrophils Relative %: 69.5 % (ref 43.0–77.0)
Platelets: 193 10*3/uL (ref 150.0–400.0)
RBC: 5.99 Mil/uL — ABNORMAL HIGH (ref 4.22–5.81)
RDW: 16.5 % — ABNORMAL HIGH (ref 11.5–15.5)
WBC: 8.9 10*3/uL (ref 4.0–10.5)

## 2020-05-01 LAB — TSH: TSH: <0.01 u[IU]/mL — ABNORMAL LOW (ref 0.35–4.50)

## 2020-05-01 LAB — T4, FREE: Free T4: 3.43 ng/dL — ABNORMAL HIGH (ref 0.60–1.60)

## 2020-05-01 NOTE — Patient Instructions (Signed)
We recommend that you follow these hyperthyroidism instructions at home:  1) Take Methimazole   If you develop severe sore throat with high fevers OR develop unexplained yellowing of your skin, eyes, under your tongue, severe abdominal pain with nausea or vomiting --> then please get evaluated immediately.     

## 2020-05-02 ENCOUNTER — Encounter: Payer: Self-pay | Admitting: Internal Medicine

## 2020-05-02 ENCOUNTER — Telehealth: Payer: Self-pay | Admitting: Internal Medicine

## 2020-05-02 MED ORDER — METHIMAZOLE 10 MG PO TABS: 10.0000 mg | ORAL_TABLET | Freq: Two times a day (BID) | ORAL | 4 refills | Status: AC

## 2020-05-02 NOTE — Telephone Encounter (Signed)
Spoken to patient and notified Dr Shamleffer's comments. Verbalized understanding.   

## 2020-05-02 NOTE — Telephone Encounter (Signed)
Please let the pt know his thyroid is still overactive, I am starting methimazole 10 mg, 1 tablet TWICE a day     Thanks    Abby Raelyn Mora, MD  [facility] Endocrinology  [facility] [address] [place], [place] [address] Phone: [phone] FAX: [phone]

## 2020-05-02 NOTE — Telephone Encounter (Signed)
Message left for patient to return my call.  

## 2020-05-04 LAB — T3: T3, Total: 266 ng/dL — ABNORMAL HIGH (ref 76–181)

## 2020-05-04 LAB — TRAB (TSH RECEPTOR BINDING ANTIBODY): TRAB: <1 IU/L (ref <=2.00)

## 2020-05-08 ENCOUNTER — Other Ambulatory Visit: Payer: Self-pay | Admitting: Family Medicine

## 2020-05-14 ENCOUNTER — Encounter
Payer: Medicare Other | Attending: Physical Medicine and Rehabilitation | Admitting: Physical Medicine and Rehabilitation

## 2020-05-14 ENCOUNTER — Other Ambulatory Visit: Payer: Self-pay

## 2020-05-14 ENCOUNTER — Encounter: Payer: Self-pay | Admitting: Physical Medicine and Rehabilitation

## 2020-05-14 VITALS — BP 136/67 | HR 84 | Temp 97.6°F | Ht 72.0 in | Wt 216.0 lb

## 2020-05-14 DIAGNOSIS — M4802 Spinal stenosis, cervical region: Secondary | ICD-10-CM | POA: Diagnosis present

## 2020-05-14 DIAGNOSIS — Z79891 Long term (current) use of opiate analgesic: Secondary | ICD-10-CM | POA: Diagnosis present

## 2020-05-14 DIAGNOSIS — M069 Rheumatoid arthritis, unspecified: Secondary | ICD-10-CM | POA: Insufficient documentation

## 2020-05-14 MED ORDER — OXYCODONE-ACETAMINOPHEN 5-325 MG PO TABS
1.0000 | ORAL_TABLET | Freq: Every day | ORAL | 0 refills | Status: DC | PRN
Start: 2020-05-14 — End: 2020-05-30

## 2020-05-14 NOTE — Patient Instructions (Addendum)
-  Discussed following foods that may reduce pain: 1) Ginger 2) Blueberries 3) Salmon 4) Pumpkin seeds 5) dark chocolate 6) turmeric 7) tart cherries 8) virgin olive oil 9) chilli peppers 10) mint  Turmeric to reduce inflammation-can be used in cooking or taken as a supplement.  Benefits of turmeric:  -Highly anti-inflammatory  -Increases antioxidants  -Improves memory, attention, brain disease  -Lowers risk of heart disease  -May help prevent cancer  -Decreases pain  -Alleviates depression  -Delays aging and decreases risk of chronic disease  -Consume with black pepper to increase absorption    Turmeric Milk Recipe:  1 cup milk  1 tsp turmeric  1 tsp cinnamon  1 tsp grated ginger (optional)  Black pepper (boosts the anti-inflammatory properties of turmeric).  1 tsp honey 

## 2020-05-14 NOTE — Progress Notes (Signed)
Subjective:    Patient ID: Charles Rice, male    DOB: 1950/11/09, 69 y.o.   MRN: [id number]  HPI  Charles Rice is a 69 year old man who presents to establish care for arthritis and neck pain s/p surgery on October 11/2 with Dr. Danielle Dess. Since his surgery he feels his neck symptoms have worsened and he regrets having surgery. He has limited range of motion of his neck (which he had before) and also has a throbbing pain at the back of his neck pain. He has taking Perocets that ran out yesterday. He also received Valium which helped him rest. The surgery was performed anteriorly and incision has been healing well. Average pain is 8/10 and it is 10/10 right now. He lives with his sister and her husband and a supportive niece.   [name] presents for follow-up of his severe arthritic pain. He has been taking 4 Percocet per day but feels that this this is not quite enough. Discussed that we can increase dose to 5 tablets per day and he is agreeable to this.  He has left shoulder surgery planned. He has a CAT scan tomorrow and will be following with his surgeon on 05/24/19. He asks who should fill his pain medications after this surgery.   Average pain is currently 9/10 and right now is 9/10  His muscle relaxers are currently not covered by insurance.   Pain Inventory Average Pain 9 Pain Right Now 9 My pain is constant, sharp, burning, dull and aching  In the last 24 hours, has pain interfered with the following? General activity 10 Relation with others 5 Enjoyment of life 10 What TIME of day is your pain at its worst? morning  and evening Sleep (in general) Poor  Pain is worse with: walking, bending, sitting, inactivity, standing and some activites Pain improves with: medication Relief from Meds: 10  walk without assistance walk with assistance use a cane use a walker how many minutes can you walk? 7 ability to climb steps?  no do you drive?  no  not employed: date last employed  2009 disabled: date disabled 2009 I need assistance with the following:  household duties and shopping  weakness numbness tingling spasms dizziness  Any changes since last visit?  no  Any changes since last visit?  no    Family History  Problem Relation Age of Onset  . Lung cancer Mother   . Lung cancer Father    Social History   Socioeconomic History  . Marital status: Single    Spouse name: Not on file  . Number of children: Not on file  . Years of education: Not on file  . Highest education level: Not on file  Occupational History  . Not on file  Tobacco Use  . Smoking status: Former Smoker    Packs/day: 3.00    Types: Cigarettes    Start date: 05/1965    Quit date: 06/2007    Years since quitting: 12.9  . Smokeless tobacco: Current User  Substance and Sexual Activity  . Alcohol use: Not Currently  . Drug use: Never  . Sexual activity: Not on file  Other Topics Concern  . Not on file  Social History Narrative  . Not on file   Social Determinants of Health   Financial Resource Strain: Not on file  Food Insecurity: Not on file  Transportation Needs: Not on file  Physical Activity: Not on file  Stress: Not on file  Social  Connections: Not on file   Past Surgical History:  Procedure Laterality Date  . BACK SURGERY    . CARDIAC SURGERY    . KNEE SURGERY     Past Medical History:  Diagnosis Date  . Aortic stenosis 2019   s/p AVR  . Benign essential HTN   . Chronic atrial fibrillation (HCC)   . Chronic diastolic CHF (congestive heart failure) (HCC)   . CKD (chronic kidney disease)   . COPD (chronic obstructive pulmonary disease) (HCC)   . Coronary artery disease    s/p CABG in [place], [place] 2019  . Hepatitis C   . Hyperlipidemia LDL goal <70   . Polysubstance abuse (HCC)   . Syncope    orthostatic hypotension in setting of dehydration from overdiuresis- [facility] at [facility]   There were no vitals taken for this  visit.  Opioid Risk Score:   Fall Risk Score:  `1  Depression screen PHQ 2/9  Depression screen [facility] 2/9 04/01/2020 03/18/2020 03/17/2020 01/29/2020 01/08/2020  Decreased Interest 1 0 0 0 2  Down, Depressed, Hopeless 1 3 2 2  0  PHQ - 2 Score 2 3 2 2 2   Altered sleeping 1 3 0 2 3  Tired, decreased energy 1 3 1 2 3   Change in appetite 2 2 1 2 3   Feeling bad or failure about yourself  0 0 1 0 0  Trouble concentrating 2 3 2 3  0  Moving slowly or fidgety/restless 1 3 0 0 0  Suicidal thoughts 0 0 0 0 0  PHQ-9 Score 9 17 7 11 11   Difficult doing work/chores - Extremely dIfficult - - -    Review of Systems  Constitutional: Negative.   HENT: Negative.   Respiratory: Negative.   Cardiovascular: Negative.   Gastrointestinal: Negative.   Endocrine: Negative.   Musculoskeletal: Positive for arthralgias, back pain, gait problem and neck pain.       Shoulder pain Elbow pain Wrist pain Foot pain   Skin: Negative.   Allergic/Immunologic: Negative.   Neurological: Positive for dizziness and numbness.       Tingling  Hematological: Negative.   All other systems reviewed and are negative.      Objective:   Physical Exam Gen: no distress, normal appearing HEENT: oral mucosa pink and moist, NCAT Cardio: Reg rate Chest: normal effort, normal rate of breathing Abd: soft, non-distended Ext: no edema Skin: incision C/D/I, 2 large lipomas on right side of neck.  Neuro: Alert Musculoskeletal: Range of motion is only 5 degrees bilaterally. Trigger points in cervical myofascia.  Psych: pleasant, normal affect     Assessment & Plan:  Charles Rice is a 69 year old man who presents to establish care for the following:  1)Chronic Pain Syndrome secondary to diffuse pain secondary to rheumatoid arthritis and cervical stenosis s/p surgery: -Discussed current symptoms of pain and history of pain.  -Discussed benefits of exercise in reducing pain. -Increase Percocet 5 times per day, can be refilled  1/19.  -Discussed following foods that may reduce pain: 1) Ginger 2) Blueberries 3) Salmon 4) Pumpkin seeds 5) dark chocolate 6) turmeric 7) tart cherries 8) virgin olive oil 9) chilli peppers 10) mint  2) Cervical myofascial pain syndrome following cervical spinal surgery -Trigger point injections, -Tizanidine 2mg  TID PRN- not covered by insurance.   3) Difficulty swallowing post-surgery: -Discussed benefits of speech therapy.   Trigger Point Injection  Indication: Cervical myofascial pain not relieved by medication management and other  conservative care.  Informed consent was obtained after describing risk and benefits of the procedure with the patient, this includes bleeding, bruising, infection and medication side effects.  The patient wishes to proceed and has given written consent.  The patient was placed in a seated position.  The cervical area was marked and prepped with Betadine.  It was entered with a 25-gauge 1/2 inch needle and a total of 5 mL of 1% lidocaine was injected into a total of 3 trigger points, after negative draw back for blood.  The patient tolerated the procedure well.  Post procedure instructions were given.

## 2020-05-15 ENCOUNTER — Other Ambulatory Visit: Payer: Self-pay | Admitting: *Deleted

## 2020-05-15 ENCOUNTER — Telehealth: Payer: Self-pay | Admitting: Internal Medicine

## 2020-05-15 ENCOUNTER — Ambulatory Visit
Admission: RE | Admit: 2020-05-15 | Discharge: 2020-05-15 | Disposition: A | Discharge status: Home / Self Care | Payer: Medicare Other | Source: Ambulatory Visit | Attending: Orthopedic Surgery | Admitting: Orthopedic Surgery

## 2020-05-15 DIAGNOSIS — M25512 Pain in left shoulder: Secondary | ICD-10-CM

## 2020-05-15 DIAGNOSIS — G8929 Other chronic pain: Secondary | ICD-10-CM

## 2020-05-15 MED ORDER — PREDNISONE 10 MG PO TABS: ORAL_TABLET | ORAL | 1 refills | Status: DC

## 2020-05-15 NOTE — Telephone Encounter (Signed)
I spoke with Mr. Arita Miss and explained to him to increase his prednisone as directed by Dr. Lucianne Muss, an rx for 10 more tablets of 10 mg prednisone was sent to his pharmacy.

## 2020-05-15 NOTE — Telephone Encounter (Signed)
Please advise 

## 2020-05-15 NOTE — Telephone Encounter (Signed)
Patient states since he started taking his methimazole he has been having a really hard time swallowing anything, even water. He thinks this is a reaction to the medicine. Please call patient at [phone]

## 2020-05-15 NOTE — Telephone Encounter (Signed)
Methimazole usually does not cause difficulty swallowing but he can stop it till he can see Dr. Deloria Lair on Monday or his PCP. Meantime he needs to take prednisone in case he has swelling in his throat. He will need to take 30 mg today, 20 mg for 2 days and then 10 mg for 2 days. Please call in prescription if he needs this. Also if he is having palpitations he can take additional metoprolol 25 mg daily

## 2020-05-15 NOTE — Telephone Encounter (Signed)
FYI

## 2020-05-18 ENCOUNTER — Other Ambulatory Visit: Payer: Self-pay | Admitting: Family Medicine

## 2020-05-22 ENCOUNTER — Ambulatory Visit (INDEPENDENT_AMBULATORY_CARE_PROVIDER_SITE_OTHER): Payer: 59 | Admitting: Orthopedic Surgery

## 2020-05-22 DIAGNOSIS — M19012 Primary osteoarthritis, left shoulder: Secondary | ICD-10-CM

## 2020-05-23 ENCOUNTER — Ambulatory Visit: Payer: Medicare Other | Admitting: Family Medicine

## 2020-05-23 LAB — ALBUMIN: Albumin: 3.9 g/dL (ref 3.6–5.1)

## 2020-05-23 LAB — VITAMIN D 25 HYDROXY (VIT D DEFICIENCY, FRACTURES): Vit D, 25-Hydroxy: 40 ng/mL (ref 30–100)

## 2020-05-23 LAB — EXTRA LAV TOP TUBE

## 2020-05-23 NOTE — Progress Notes (Signed)
I called and left message on machine that his labs look good and whenever he wanted to get scheduled for shoulder replacement he should call and we will get it set up.

## 2020-05-24 ENCOUNTER — Encounter: Payer: Self-pay | Admitting: Orthopedic Surgery

## 2020-05-24 NOTE — Progress Notes (Signed)
Office Visit Note   Patient: Charles Rice           Date of Birth: [birth year]           MRN: [id number] Visit Date: 05/22/2020 Requested by: Derrel Nip, MD [address] [place],  [place] [address] PCP: Derrel Nip, MD  Subjective: Chief Complaint  Patient presents with  . scan review    HPI: Charles Rice is a 70 y.o. male who presents to the office complaining of left shoulder pain.  Patient returns to discuss CT scan results.  CT revealed severe glenohumeral osteoarthritis.  CT scan was obtained for preoperative planning purposes in anticipation of left shoulder arthroplasty.  He has history of long history of left shoulder pain.  This pain wakes him up at night.  He has severely limited range of motion and function.  He does have history of heart surgery in 2019.  His cardiologist is Dr. Mayford Knife.  He is on Eliquis.  He had recent surgery by Dr. Danielle Dess on his neck in late 2021.  He had cardiac clearance at that time which recommended discontinuing Eliquis 3 days prior to procedure.  No major updates to his medical history in the interval.  He is in pain management and takes for tablets of Percocet per day.  Denies any history of diabetes.  He has planned eye surgery on June 16, 2020.  He does smoke cigarettes and goes through 1 pack of cigarettes every 4 days.                ROS: All systems reviewed are negative as they relate to the chief complaint within the history of present illness.  Patient denies fevers or chills.  Assessment & Plan: Visit Diagnoses:  1. Arthritis of left shoulder region     Plan: Patient is a 70 year old male who presents with severe glenohumeral osteoarthritis.  CT scan obtained for preoperative planning purposes.  He has severely limited range of motion of the left shoulder secondary to the arthritis.  No history of diabetes but he does have history of heart surgery in 2019.  Cardiologist is Dr. Mayford Knife and he has been cleared for his neck  surgery on 02/22/2020.  With no updates to his medical history in the interval, plan to proceed with left shoulder surgery.  He will discontinue Eliquis 3 days prior to procedure and then resume Eliquis following the surgery.  He is in pain management which likely means that his postop pain will be increasing his pain management regimen will have to be adjusted.  We will assume his pain management for the 6 weeks following his procedure.  Informed patient of this and he will reach out to his pain management physician to let their office know that we will be prescribing pain medication after his surgery and then he will return in their care 6 weeks following the procedure.  Plan for left shoulder arthroplasty in mid February.  Eunice Blase will call to schedule him and post him for procedure.  Albumin and vitamin D obtained, will call patient with lab results.  Time albumin and vitamin D levels acceptable for surgery at this time at the time of this dictation.  Follow-Up Instructions: No follow-ups on file.   Orders:  Orders Placed This Encounter  Procedures  . Vitamin D (25 hydroxy)  . Albumin  . EXTRA LAV TOP TUBE   No orders of the defined types were placed in this encounter.     Procedures:  No procedures performed   Clinical Data: No additional findings.  Objective: Vital Signs: There were no vitals taken for this visit.  Physical Exam:  Constitutional: Patient appears well-developed HEENT:  Head: Normocephalic Eyes:EOM are normal Neck: Normal range of motion Cardiovascular: Normal rate Pulmonary/chest: Effort normal Neurologic: Patient is alert Skin: Skin is warm Psychiatric: Patient has normal mood and affect  Ortho Exam: Ortho exam demonstrates left shoulder with 20 degrees external rotation, 50 degrees abduction, 70 degrees forward flexion.  Slight weakness with external rotation but excellent strength with supraspinatus and subscapularis.  Deltoid fires and axillary nerve  intact.  Specialty Comments:  No specialty comments available.  Imaging: No results found.   PMFS History: Patient Active Problem List   Diagnosis Date Noted  . Tremor 04/02/2020  . Somnolence 03/17/2020  . Frequent falls 03/17/2020  . Weakness 03/17/2020  . CKD (chronic kidney disease)   . Chronic diastolic CHF (congestive heart failure) (HCC)   . Hepatitis C   . Polysubstance abuse (HCC)   . Syncope   . Hyperlipidemia LDL goal <70   . Chronic shoulder pain   . Atrial fibrillation (HCC)   . Encounter to establish care 01/08/2020  . Depression 01/08/2020  . Hypertension 01/08/2020  . Chronic pain 01/08/2020  . COPD (chronic obstructive pulmonary disease) (HCC) 01/08/2020  . S/P AVR (aortic valve replacement) 2019   Past Medical History:  Diagnosis Date  . Aortic stenosis 2019   s/p AVR  . Benign essential HTN   . Chronic atrial fibrillation (HCC)   . Chronic diastolic CHF (congestive heart failure) (HCC)   . CKD (chronic kidney disease)   . COPD (chronic obstructive pulmonary disease) (HCC)   . Coronary artery disease    s/p CABG in [place], [place] 2019  . Hepatitis C   . Hyperlipidemia LDL goal <70   . Polysubstance abuse (HCC)   . Syncope    orthostatic hypotension in setting of dehydration from overdiuresis- Treated at [facility]    Family History  Problem Relation Age of Onset  . Lung cancer Mother   . Lung cancer Father     Past Surgical History:  Procedure Laterality Date  . BACK SURGERY    . CARDIAC SURGERY    . KNEE SURGERY     Social History   Occupational History  . Not on file  Tobacco Use  . Smoking status: Former Smoker    Packs/day: 3.00    Types: Cigarettes    Start date: 05/1965    Quit date: 06/2007    Years since quitting: 12.9  . Smokeless tobacco: Current User  Substance and Sexual Activity  . Alcohol use: Not Currently  . Drug use: Never  . Sexual activity: Not on file

## 2020-05-27 ENCOUNTER — Telehealth: Payer: Self-pay

## 2020-05-27 NOTE — Telephone Encounter (Signed)
Patient called stating he moved to [place], [place] and all [facility] [place], [place] except pain meds. I wanted to know if the [facility] can be [facility] [place] and sent to [place]. He states he will be coming to February appt.

## 2020-05-29 NOTE — Telephone Encounter (Signed)
I have called the [facility] and had them delete the Rx for oxycodone 5/325.  It needs to be sent to his new pharmacy in [place].  I have put in the pharmacy information.  Mr Priest says he will be at his appt on 06/24/20. He needs the new Rx for oxycodone sent to the new pharmacy.

## 2020-05-29 NOTE — Telephone Encounter (Signed)
Sandi Raveling, would you mind [facility] [place] [facility] to confirm that medication has not yet been filled there? Thank you!

## 2020-05-30 ENCOUNTER — Other Ambulatory Visit: Payer: Self-pay | Admitting: Physical Medicine and Rehabilitation

## 2020-05-30 MED ORDER — OXYCODONE-ACETAMINOPHEN 5-325 MG PO TABS: 1.0000 | ORAL_TABLET | Freq: Every day | ORAL | 0 refills | Status: DC | PRN

## 2020-06-03 NOTE — Telephone Encounter (Signed)
I have sent the medication.

## 2020-06-04 ENCOUNTER — Ambulatory Visit: Payer: Medicare Other

## 2020-06-04 ENCOUNTER — Encounter: Payer: Medicare Other | Admitting: Acute Care

## 2020-06-13 ENCOUNTER — Telehealth: Payer: Self-pay | Admitting: Orthopedic Surgery

## 2020-06-13 NOTE — Telephone Encounter (Signed)
Ok thx.

## 2020-06-13 NOTE — Telephone Encounter (Signed)
Patient called to say he has decided not to have the left reverse shoulder done here in [place].  States he lives 175 miles away.  After talking to his sister they have decided this would be too much for therm to drive back and forth.

## 2020-06-13 NOTE — Telephone Encounter (Signed)
See below

## 2020-06-17 ENCOUNTER — Other Ambulatory Visit: Payer: Self-pay | Admitting: Family Medicine

## 2020-06-22 DIAGNOSIS — J9601 Acute respiratory failure with hypoxia: Secondary | ICD-10-CM | POA: Insufficient documentation

## 2020-06-22 DIAGNOSIS — N179 Acute kidney failure, unspecified: Secondary | ICD-10-CM | POA: Insufficient documentation

## 2020-06-22 DIAGNOSIS — J1282 Pneumonia due to coronavirus disease 2019: Secondary | ICD-10-CM | POA: Insufficient documentation

## 2020-06-22 DIAGNOSIS — G9341 Metabolic encephalopathy: Secondary | ICD-10-CM | POA: Insufficient documentation

## 2020-06-22 DIAGNOSIS — U071 COVID-19: Secondary | ICD-10-CM | POA: Insufficient documentation

## 2020-06-24 ENCOUNTER — Ambulatory Visit: Payer: Medicare Other | Admitting: Physical Medicine and Rehabilitation

## 2020-06-26 ENCOUNTER — Other Ambulatory Visit: Payer: Self-pay | Admitting: Physical Medicine and Rehabilitation

## 2020-06-26 ENCOUNTER — Telehealth: Payer: Self-pay | Admitting: *Deleted

## 2020-06-26 MED ORDER — OXYCODONE-ACETAMINOPHEN 5-325 MG PO TABS: 1.0000 | ORAL_TABLET | Freq: Every day | ORAL | 0 refills | Status: DC | PRN

## 2020-06-26 NOTE — Telephone Encounter (Signed)
Charles Rice called for a refill on his oxycodone 5/325.  He has enough to last to the 13th. His last refill per PMP was 05/31/20.  His next appt is 07/15/20.

## 2020-06-26 NOTE — Telephone Encounter (Signed)
Sent!

## 2020-06-30 ENCOUNTER — Other Ambulatory Visit: Payer: Self-pay | Admitting: Family Medicine

## 2020-07-01 ENCOUNTER — Other Ambulatory Visit: Payer: Self-pay | Admitting: Endocrinology

## 2020-07-01 ENCOUNTER — Telehealth: Payer: Self-pay | Admitting: *Deleted

## 2020-07-01 NOTE — Telephone Encounter (Signed)
Please advise 

## 2020-07-01 NOTE — Telephone Encounter (Signed)
I have forward the Rx for prednisone to patient's pcp

## 2020-07-01 NOTE — Telephone Encounter (Signed)
No refill on prednisone. He takes it for his COPD and needs to get it from his PCP or lung specialist. I prescribe Methimazole to him

## 2020-07-10 ENCOUNTER — Other Ambulatory Visit: Payer: Self-pay | Admitting: Family Medicine

## 2020-07-11 ENCOUNTER — Ambulatory Visit: Payer: Medicare Other | Admitting: Physical Medicine and Rehabilitation

## 2020-07-12 ENCOUNTER — Other Ambulatory Visit: Payer: Self-pay | Admitting: Physical Medicine and Rehabilitation

## 2020-07-15 ENCOUNTER — Ambulatory Visit: Payer: Medicare Other | Admitting: Physical Medicine and Rehabilitation

## 2020-07-16 ENCOUNTER — Ambulatory Visit: Payer: 59 | Admitting: Cardiology

## 2020-07-29 ENCOUNTER — Telehealth: Payer: Self-pay

## 2020-07-29 ENCOUNTER — Other Ambulatory Visit: Payer: Self-pay | Admitting: Physical Medicine and Rehabilitation

## 2020-07-29 MED ORDER — OXYCODONE-ACETAMINOPHEN 5-325 MG PO TABS
1.0000 | ORAL_TABLET | Freq: Every day | ORAL | 0 refills | Status: AC | PRN
Start: 2020-07-29 — End: ?

## 2020-07-29 NOTE — Telephone Encounter (Signed)
Sent!

## 2020-07-29 NOTE — Telephone Encounter (Signed)
Patient notified

## 2020-07-29 NOTE — Telephone Encounter (Signed)
Patient called requesting refill on Percocet, LF 06/27/20 #150, NA 08/08/20. [facility] [place]

## 2020-07-30 ENCOUNTER — Ambulatory Visit: Payer: Medicare Other | Admitting: Internal Medicine

## 2020-07-30 NOTE — Progress Notes (Deleted)
Name: Charles Rice  MRN/ DOB: [id number], 12-31-1950    Age/ Sex: 70 y.o., male     PCP: Derrel Nip, MD   Reason for Endocrinology Evaluation: Hyperthyroidism     Initial Endocrinology Clinic Visit: 05/01/2020    PATIENT IDENTIFIER: Mr. Charles Rice is a 70 y.o., male with a past medical history of  HTN, A. Fib, CHF , COPD and S/P AVR. He has followed with [facility] since 05/01/2020 for consultative assistance with management of his hyperthyroidism.   HISTORICAL SUMMARY:  Pt has been noted with low TSH at 0.023 uIU/mL on 04/01/2020 with elevated FT4 2.87 ng/dL and free T3 pg/mL during evaluation for tremors.    He believes he was on Amiodarone but has been off for the past year   He was started on methimazole 04/2020 TRAb negative < 1.10 IU/L     No FH of thyroid disease    He has been on prednisone every other day for 3 years for COPD   SUBJECTIVE:     Today (07/30/2020):  Mr. Charles Rice is here for a follow up on Hyperthyroidism.   Methimazole 10 mg BID     HISTORY:  Past Medical History:  Past Medical History:  Diagnosis Date  . Aortic stenosis 2019   s/p AVR  . Benign essential HTN   . Chronic atrial fibrillation (HCC)   . Chronic diastolic CHF (congestive heart failure) (HCC)   . CKD (chronic kidney disease)   . COPD (chronic obstructive pulmonary disease) (HCC)   . Coronary artery disease    s/p CABG in [place], [place] 2019  . Hepatitis C   . Hyperlipidemia LDL goal <70   . Polysubstance abuse (HCC)   . Syncope    orthostatic hypotension in setting of dehydration from overdiuresis- Treated at [facility]    Past Surgical History:  Past Surgical History:  Procedure Laterality Date  . BACK SURGERY    . CARDIAC SURGERY    . KNEE SURGERY       Social History:  reports that he quit smoking about 13 years ago. His smoking use included cigarettes. He started smoking about 55 years ago. He smoked 3.00 packs  per day. He uses smokeless tobacco. He reports previous alcohol use. He reports that he does not use drugs. Family History:  Family History  Problem Relation Age of Onset  . Lung cancer Mother   . Lung cancer Father       HOME MEDICATIONS: Allergies as of 07/30/2020   No Known Allergies     Medication List       Accurate as of July 30, 2020  7:07 AM. If you have any questions, ask your nurse or doctor.        albuterol 108 (90 Base) MCG/ACT inhaler Commonly known as: VENTOLIN HFA Inhale 2 puffs into the lungs every 6 (six) hours as needed.   amitriptyline 150 MG tablet Commonly known as: ELAVIL TAKE 1 TABLET BY MOUTH AT BEDTIME.   Anoro Ellipta 62.5-25 MCG/INH Aepb Generic drug: umeclidinium-vilanterol Inhale 1 puff into the lungs daily.   apixaban 5 MG Tabs tablet Commonly known as: ELIQUIS Take 1 tablet (5 mg total) by mouth 2 (two) times daily.   atorvastatin 20 MG tablet Commonly known as: LIPITOR Take 1 tablet (20 mg total) by mouth daily.   Atrovent HFA 17 MCG/ACT inhaler Generic drug: ipratropium Inhale 2 puffs into the lungs every 6 (six) hours as needed for wheezing.  diazepam 5 MG tablet Commonly known as: VALIUM Take 5 mg by mouth every 6 (six) hours as needed for anxiety.   ferrous sulfate 325 (65 FE) MG tablet TAKE 1 TABLET BY MOUTH EVERY OTHER DAY   furosemide 80 MG tablet Commonly known as: LASIX TAKE 2 TABLETS BY MOUTH EVERY MORNING AND 1 TABLET EVERY EVENING   magnesium oxide 400 (241.3 Mg) MG tablet Commonly known as: MAG-OX TAKE 1 TABLET BY MOUTH EVERY DAY   methimazole 10 MG tablet Commonly known as: TAPAZOLE Take 1 tablet (10 mg total) by mouth 2 (two) times daily.   methocarbamol 500 MG tablet Commonly known as: ROBAXIN Take 1 tablet (500 mg total) by mouth every 8 (eight) hours as needed for muscle spasms.   metoprolol tartrate 25 MG tablet Commonly known as: LOPRESSOR TAKE 1 TABLET BY MOUTH TWICE A DAY    oxyCODONE-acetaminophen 5-325 MG tablet Commonly known as: PERCOCET/ROXICET Take 1 tablet by mouth 5 (five) times daily as needed.   potassium chloride 10 MEQ tablet Commonly known as: KLOR-CON TAKE 1 TABLET BY MOUTH EVERY DAY   predniSONE 10 MG tablet Commonly known as: DELTASONE TAKE 3 TABLETS BY MOUTH TODAY, THEN TAKE 2 TABS FOR THE NEXT 2 DAYS, THEN TAKE 1 TAB FOR 2 DAYS   spironolactone 25 MG tablet Commonly known as: ALDACTONE TAKE 1 TABLET BY MOUTH EVERY DAY   tizanidine 2 MG capsule Commonly known as: ZANAFLEX Take 1 capsule (2 mg total) by mouth 3 (three) times daily as needed for muscle spasms.   topiramate 100 MG tablet Commonly known as: TOPAMAX TAKE 1 TABLET BY MOUTH EVERY DAY         OBJECTIVE:   PHYSICAL EXAM: VS: There were no vitals taken for this visit.   EXAM: General: Pt appears well and is in NAD  Hydration: Well-hydrated with moist mucous membranes and good skin turgor  Eyes: External eye exam normal without stare, lid lag or exophthalmos.  EOM intact.  PERRL.  Ears, Nose, Throat: Hearing: Grossly intact bilaterally Dental: Good dentition  Throat: Clear without mass, erythema or exudate  Neck: General: Supple without adenopathy. Thyroid: Thyroid size normal.  No goiter or nodules appreciated. No thyroid bruit.  Lungs: Clear with good BS bilat with no rales, rhonchi, or wheezes  Heart: Auscultation: RRR.  Abdomen: Normoactive bowel sounds, soft, nontender, without masses or organomegaly palpable  Extremities: Gait and station: Normal gait  Digits and nails: No clubbing, cyanosis, petechiae, or nodes Head and neck: Normal alignment and mobility BL UE: Normal ROM and strength. BL LE: No pretibial edema normal ROM and strength.  Skin: Hair: Texture and amount normal with gender appropriate distribution Skin Inspection: No rashes, acanthosis nigricans/skin tags. No lipohypertrophy Skin Palpation: Skin temperature, texture, and thickness normal  to palpation  Neuro: Cranial nerves: II - XII grossly intact  Cerebellar: Normal coordination and movement; no tremor Motor: Normal strength throughout DTRs: 2+ and symmetric in UE without delay in relaxation phase  Mental Status: Judgment, insight: Intact Orientation: Oriented to time, place, and person Memory: Intact for recent and remote events Mood and affect: No depression, anxiety, or agitation     DATA REVIEWED: ***    ASSESSMENT / PLAN / RECOMMENDATIONS:   1. Hyperthyroidism:  Plan:  ***    Medications   ***   Signed electronically by: Lyndle Herrlich, MD  [facility]  [facility] [address] [place], [place] [address] Phone: [phone] FAX: [phone]  CC: Derrel Nip, MD [address] [place] [address] Phone: [phone]  Fax: [phone]   Return to Endocrinology clinic as below: Future Appointments  Date Time Provider Department Center  07/30/2020  9:30 AM Atalie Oros, Konrad Dolores, MD [facility] None  08/08/2020  1:00 PM Raulkar, Drema Pry, MD [facility]

## 2020-08-08 ENCOUNTER — Encounter: Payer: 59 | Admitting: Physical Medicine and Rehabilitation

## 2020-08-08 ENCOUNTER — Other Ambulatory Visit: Payer: Self-pay | Admitting: Family Medicine

## 2020-08-11 DIAGNOSIS — K649 Unspecified hemorrhoids: Secondary | ICD-10-CM | POA: Insufficient documentation

## 2020-08-11 DIAGNOSIS — I5020 Unspecified systolic (congestive) heart failure: Secondary | ICD-10-CM | POA: Insufficient documentation

## 2020-09-15 ENCOUNTER — Encounter: Payer: Self-pay | Admitting: Family Medicine

## 2021-02-24 IMAGING — CT CT SHOULDER*L* W/O CM
1 of 2 series · 9 of 14 positions shown, 12 images · non-contrast
Comparison: Plain films left shoulder 04/17/2020.

CLINICAL DATA: Chronic left shoulder pain and limited range of
motion. Patient for shoulder replacement.

EXAM:
CT OF THE UPPER LEFT EXTREMITY WITHOUT CONTRAST
TECHNIQUE: Multidetector CT imaging of the upper left extremity was performed
according to the standard protocol.

[Series 5: thin soft · axial · 0.54mm/px · z∈[-216,-54]mm · 9 of 339 slices shown, 12 images]
[im 34/339  soft-tissue]
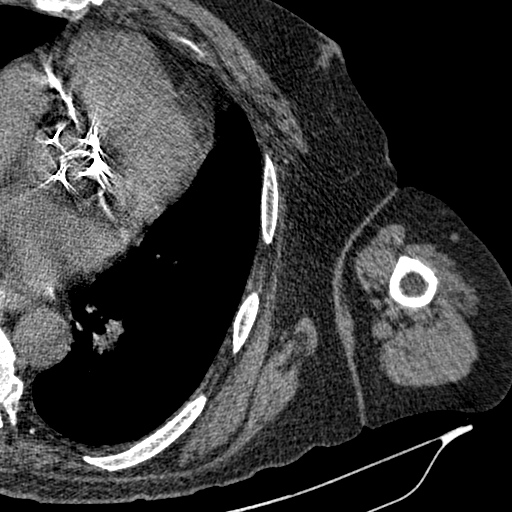
[im 34/339  bone]
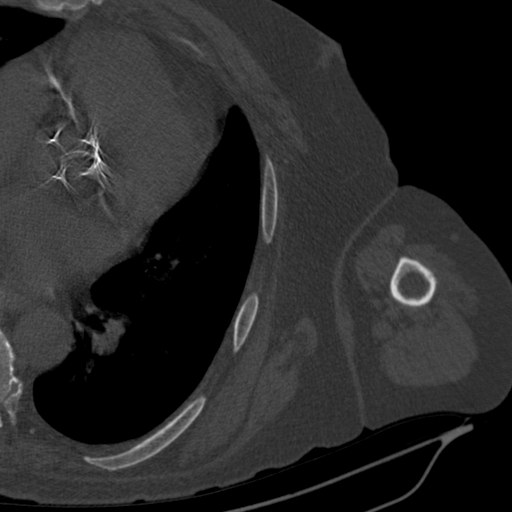
[im 68/339  bone]
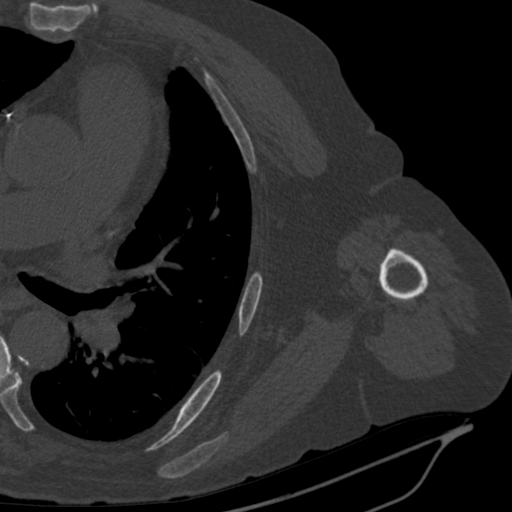
[im 102/339  bone]
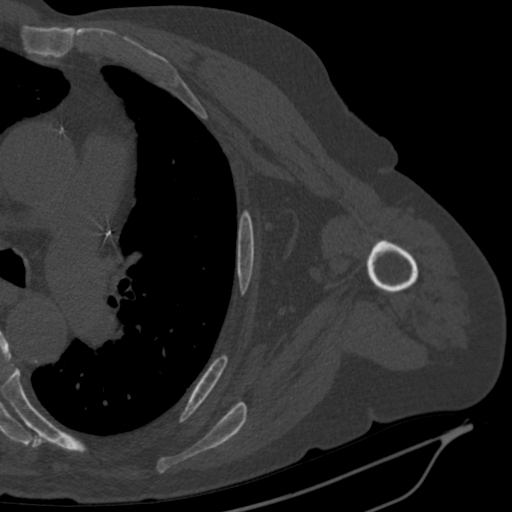
[im 136/339  bone]
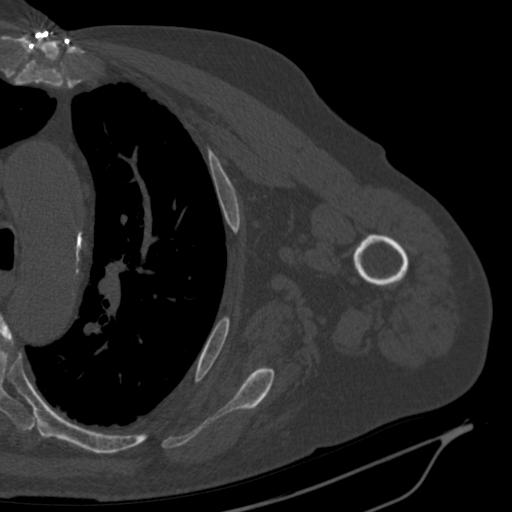
[im 170/339  soft-tissue]
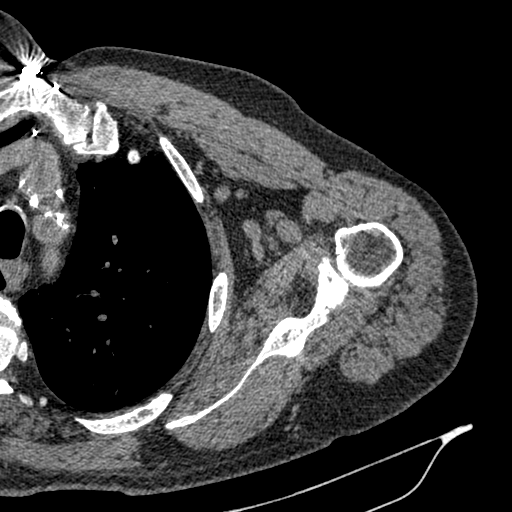
[im 170/339  bone]
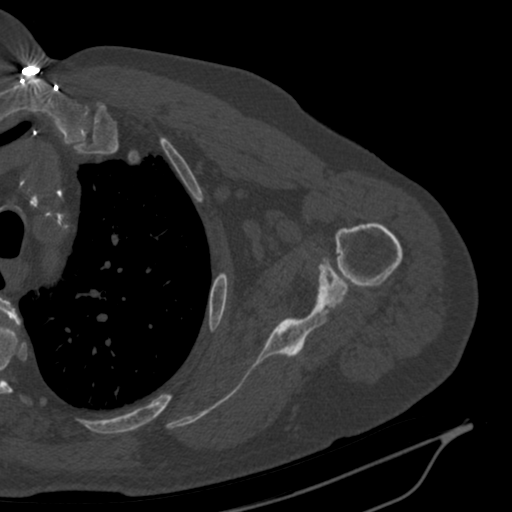
[im 203/339  bone]
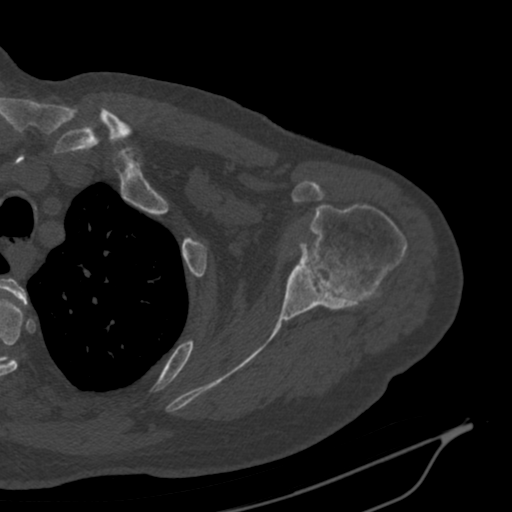
[im 237/339  bone]
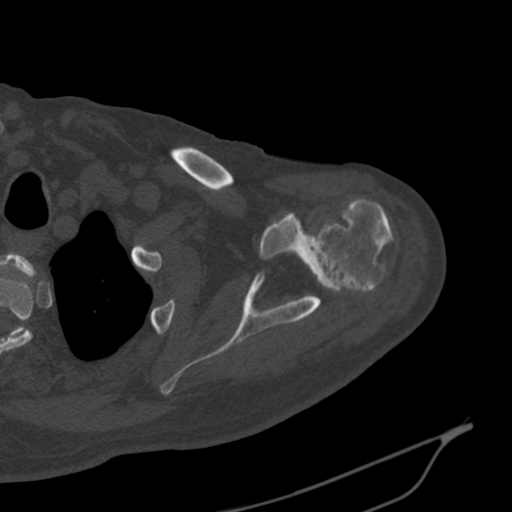
[im 271/339  bone]
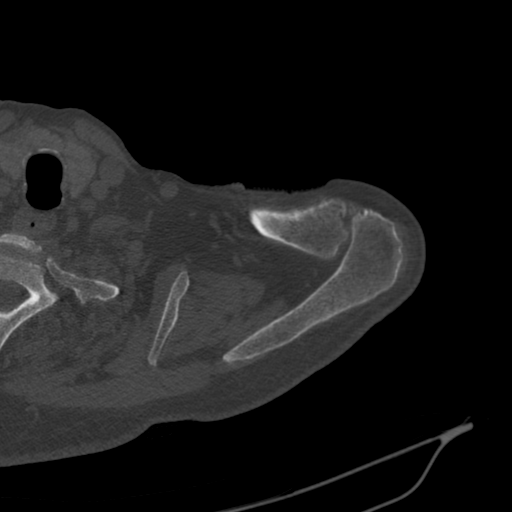
[im 305/339  soft-tissue]
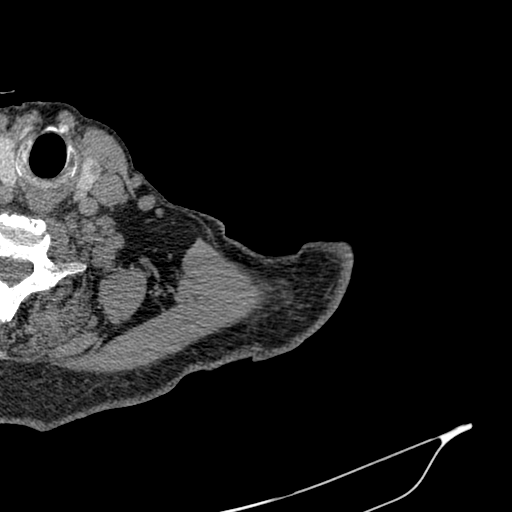
[im 305/339  bone]
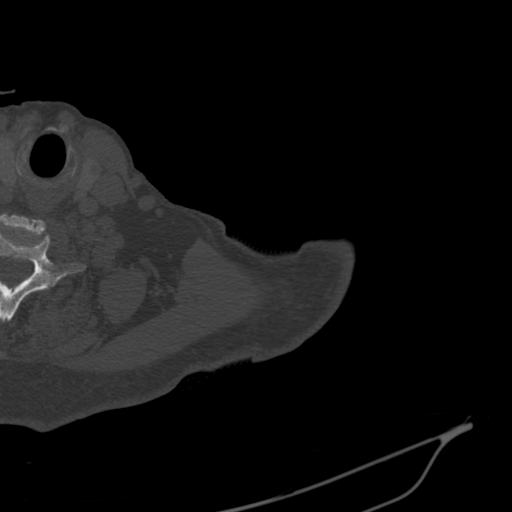

[9 of 14 positions shown; findings below may reference images not displayed]

FINDINGS: Bones/Joint/Cartilage

The patient has severe glenohumeral osteoarthritis. There is
bone-on-bone joint space narrowing and multiple small subchondral
cysts in both the glenoid and humeral head. The glenoid is remodeled
and there is osteophytosis about the glenoid. No loose body is seen
in the joint.

The patient has moderately severe acromioclavicular osteoarthritis.
The acromion is type 2 with a prominent subacromial spur anteriorly.
No lytic or sclerotic lesion is identified. No acute abnormality.

Ligaments

Suboptimally assessed by CT.

Muscles and Tendons

Mild-to-moderate fatty atrophy of rotator cuff musculature appears
worst in the subscapularis. As visualized by CT, the cuff tendons
appear intact.

Soft tissues

Calcific aortic and coronary atherosclerosis is noted. The patient
is status post CABG. Lungs demonstrate emphysematous disease.
IMPRESSION: Severe glenohumeral osteoarthritis.

Moderately severe acromioclavicular osteoarthritis. Type 2 acromion
and subacromial spurring noted.

Aortic Atherosclerosis (WZE6X-JCX.X) and Emphysema (WZE6X-JFH.Q).
